# Patient Record
Sex: Female | Born: 1950 | Hispanic: No | Marital: Married | Smoking: Never smoker
Health system: Southern US, Community
[De-identification: ages and names within clinical notes are randomized; demographics above are authoritative.]

## PROBLEM LIST (undated history)

## (undated) DIAGNOSIS — E039 Hypothyroidism, unspecified: Secondary | ICD-10-CM

## (undated) DIAGNOSIS — Z5189 Encounter for other specified aftercare: Secondary | ICD-10-CM

## (undated) DIAGNOSIS — M1990 Unspecified osteoarthritis, unspecified site: Secondary | ICD-10-CM

## (undated) DIAGNOSIS — T7840XA Allergy, unspecified, initial encounter: Secondary | ICD-10-CM

## (undated) DIAGNOSIS — E785 Hyperlipidemia, unspecified: Secondary | ICD-10-CM

## (undated) DIAGNOSIS — E119 Type 2 diabetes mellitus without complications: Secondary | ICD-10-CM

## (undated) HISTORY — PX: ACROMIOPLASTY: SHX131

## (undated) HISTORY — PX: CARPAL TUNNEL RELEASE: SHX101

## (undated) HISTORY — DX: Hypothyroidism, unspecified: E03.9

## (undated) HISTORY — DX: Unspecified osteoarthritis, unspecified site: M19.90

## (undated) HISTORY — DX: Type 2 diabetes mellitus without complications: E11.9

## (undated) HISTORY — PX: ECTOPIC PREGNANCY SURGERY: SHX613

## (undated) HISTORY — DX: Hyperlipidemia, unspecified: E78.5

## (undated) HISTORY — PX: BREAST REDUCTION SURGERY: SHX8

## (undated) HISTORY — DX: Allergy, unspecified, initial encounter: T78.40XA

## (undated) HISTORY — DX: Encounter for other specified aftercare: Z51.89

---
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
## 1975-04-27 HISTORY — PX: SEPTOPLASTY: SUR1290

## 1985-04-26 HISTORY — PX: MYOMECTOMY: SHX85

## 1997-04-26 HISTORY — PX: LASIK: SHX215

## 1998-04-26 HISTORY — PX: ABDOMINAL HYSTERECTOMY: SHX81

## 2008-04-26 HISTORY — PX: ULNAR NERVE TRANSPOSITION: SHX2595

## 2008-12-21 ENCOUNTER — Encounter: Payer: Self-pay | Admitting: Internal Medicine

## 2009-04-24 ENCOUNTER — Ambulatory Visit: Payer: Self-pay | Admitting: Internal Medicine

## 2009-04-24 DIAGNOSIS — E785 Hyperlipidemia, unspecified: Secondary | ICD-10-CM

## 2009-04-24 DIAGNOSIS — Z860100 Personal history of colon polyps, unspecified: Secondary | ICD-10-CM | POA: Insufficient documentation

## 2009-04-24 DIAGNOSIS — M67919 Unspecified disorder of synovium and tendon, unspecified shoulder: Secondary | ICD-10-CM | POA: Insufficient documentation

## 2009-04-24 DIAGNOSIS — E1165 Type 2 diabetes mellitus with hyperglycemia: Secondary | ICD-10-CM | POA: Insufficient documentation

## 2009-04-24 DIAGNOSIS — I10 Essential (primary) hypertension: Secondary | ICD-10-CM | POA: Insufficient documentation

## 2009-04-24 DIAGNOSIS — M719 Bursopathy, unspecified: Secondary | ICD-10-CM

## 2009-04-24 LAB — CONVERTED CEMR LAB
AST: 26 units/L (ref 0–37)
Albumin: 4.3 g/dL (ref 3.5–5.2)
Basophils Relative: 1.9 % (ref 0.0–3.0)
CO2: 25 meq/L (ref 19–32)
Chloride: 105 meq/L (ref 96–112)
Cholesterol: 145 mg/dL (ref 0–200)
Creatinine, Ser: 0.6 mg/dL (ref 0.4–1.2)
Eosinophils Relative: 5 % (ref 0.0–5.0)
Hemoglobin, Urine: NEGATIVE
Hgb A1c MFr Bld: 7.5 % — ABNORMAL HIGH (ref 4.6–6.5)
LDL Cholesterol: 82 mg/dL (ref 0–99)
Lymphocytes Relative: 39.8 % (ref 12.0–46.0)
Lymphs Abs: 1.7 10*3/uL (ref 0.7–4.0)
MCHC: 34.1 g/dL (ref 30.0–36.0)
Monocytes Absolute: 0.3 10*3/uL (ref 0.1–1.0)
Monocytes Relative: 6.4 % (ref 3.0–12.0)
Neutrophils Relative %: 46.9 % (ref 43.0–77.0)
Nitrite: NEGATIVE
Sodium: 140 meq/L (ref 135–145)
Specific Gravity, Urine: 1.02 (ref 1.000–1.030)
TSH: 3.3 microintl units/mL (ref 0.35–5.50)
Total Bilirubin: 1 mg/dL (ref 0.3–1.2)
Total CHOL/HDL Ratio: 4
Urine Glucose: NEGATIVE mg/dL
VLDL: 27.6 mg/dL (ref 0.0–40.0)
WBC: 4.2 10*3/uL — ABNORMAL LOW (ref 4.5–10.5)
pH: 5.5 (ref 5.0–8.0)

## 2009-04-26 HISTORY — PX: SHOULDER ARTHROSCOPY: SHX128

## 2009-04-26 HISTORY — PX: ANKLE SURGERY: SHX546

## 2009-04-29 ENCOUNTER — Telehealth: Payer: Self-pay | Admitting: Internal Medicine

## 2009-04-29 ENCOUNTER — Encounter: Payer: Self-pay | Admitting: Internal Medicine

## 2009-04-30 ENCOUNTER — Encounter: Payer: Self-pay | Admitting: Internal Medicine

## 2009-05-12 ENCOUNTER — Encounter: Admission: RE | Admit: 2009-05-12 | Discharge: 2009-05-12 | Payer: Self-pay | Admitting: Internal Medicine

## 2009-06-09 ENCOUNTER — Telehealth: Payer: Self-pay | Admitting: Internal Medicine

## 2009-06-16 ENCOUNTER — Encounter: Payer: Self-pay | Admitting: Internal Medicine

## 2009-06-25 ENCOUNTER — Telehealth: Payer: Self-pay | Admitting: Internal Medicine

## 2009-07-22 ENCOUNTER — Ambulatory Visit: Payer: Self-pay | Admitting: Internal Medicine

## 2009-07-22 DIAGNOSIS — K7689 Other specified diseases of liver: Secondary | ICD-10-CM | POA: Insufficient documentation

## 2009-08-04 ENCOUNTER — Encounter: Payer: Self-pay | Admitting: Internal Medicine

## 2009-09-23 ENCOUNTER — Ambulatory Visit: Payer: Self-pay | Admitting: Internal Medicine

## 2009-09-23 LAB — CONVERTED CEMR LAB
ALT: 53 units/L — ABNORMAL HIGH (ref 0–35)
AST: 28 units/L (ref 0–37)
Albumin: 4.6 g/dL (ref 3.5–5.2)
Alkaline Phosphatase: 56 units/L (ref 39–117)
BUN: 15 mg/dL (ref 6–23)
Basophils Absolute: 0 10*3/uL (ref 0.0–0.1)
Basophils Relative: 0.4 % (ref 0.0–3.0)
Bilirubin, Direct: 0.1 mg/dL (ref 0.0–0.3)
Calcium: 10.2 mg/dL (ref 8.4–10.5)
Cholesterol: 162 mg/dL (ref 0–200)
Creatinine, Ser: 0.6 mg/dL (ref 0.4–1.2)
Eosinophils Relative: 2.1 % (ref 0.0–5.0)
GFR calc non Af Amer: 110.64 mL/min (ref >60)
Glucose, Bld: 135 mg/dL — ABNORMAL HIGH (ref 70–99)
Hemoglobin: 13.8 g/dL (ref 12.0–15.0)
Lymphocytes Relative: 40 % (ref 12.0–46.0)
Lymphs Abs: 1.8 10*3/uL (ref 0.7–4.0)
MCHC: 33.6 g/dL (ref 30.0–36.0)
Monocytes Absolute: 0.3 10*3/uL (ref 0.1–1.0)
Monocytes Relative: 6.3 % (ref 3.0–12.0)
Neutro Abs: 2.2 10*3/uL (ref 1.4–7.7)
Neutrophils Relative %: 51.2 % (ref 43.0–77.0)
Total CHOL/HDL Ratio: 4
Total Protein: 7.5 g/dL (ref 6.0–8.3)
VLDL: 34 mg/dL (ref 0.0–40.0)
WBC: 4.4 10*3/uL — ABNORMAL LOW (ref 4.5–10.5)

## 2009-12-15 ENCOUNTER — Encounter: Payer: Self-pay | Admitting: Internal Medicine

## 2009-12-26 ENCOUNTER — Encounter: Admission: RE | Admit: 2009-12-26 | Discharge: 2009-12-26 | Payer: Self-pay | Admitting: Orthopedic Surgery

## 2010-01-06 ENCOUNTER — Other Ambulatory Visit: Admission: RE | Admit: 2010-01-06 | Discharge: 2010-01-06 | Payer: Self-pay | Admitting: Internal Medicine

## 2010-01-06 ENCOUNTER — Ambulatory Visit: Payer: Self-pay | Admitting: Internal Medicine

## 2010-01-06 LAB — CONVERTED CEMR LAB
Albumin: 4.4 g/dL (ref 3.5–5.2)
Alkaline Phosphatase: 51 units/L (ref 39–117)
Basophils Relative: 0.9 % (ref 0.0–3.0)
Bilirubin, Direct: 0.1 mg/dL (ref 0.0–0.3)
CO2: 28 meq/L (ref 19–32)
Chloride: 106 meq/L (ref 96–112)
Creatinine, Ser: 0.6 mg/dL (ref 0.4–1.2)
Eosinophils Absolute: 0.1 10*3/uL (ref 0.0–0.7)
Eosinophils Relative: 3.6 % (ref 0.0–5.0)
GFR calc non Af Amer: 110.53 mL/min (ref >60)
Glucose, Bld: 101 mg/dL — ABNORMAL HIGH (ref 70–99)
HCT: 38 % (ref 36.0–46.0)
Hemoglobin: 12.9 g/dL (ref 12.0–15.0)
Leukocytes, UA: NEGATIVE
Lymphocytes Relative: 39 % (ref 12.0–46.0)
Lymphs Abs: 1.6 10*3/uL (ref 0.7–4.0)
MCV: 90 fL (ref 78.0–100.0)
Monocytes Absolute: 0.2 10*3/uL (ref 0.1–1.0)
Monocytes Relative: 5.8 % (ref 3.0–12.0)
Pap Smear: NEGATIVE
Platelets: 168 10*3/uL (ref 150.0–400.0)
RBC: 4.23 M/uL (ref 3.87–5.11)
Total Bilirubin: 0.5 mg/dL (ref 0.3–1.2)
Total CHOL/HDL Ratio: 4
Total Protein, Urine: NEGATIVE mg/dL
Total Protein: 6.8 g/dL (ref 6.0–8.3)
WBC: 4.1 10*3/uL — ABNORMAL LOW (ref 4.5–10.5)

## 2010-01-07 ENCOUNTER — Encounter: Payer: Self-pay | Admitting: Internal Medicine

## 2010-02-12 ENCOUNTER — Encounter: Payer: Self-pay | Admitting: Internal Medicine

## 2010-02-26 ENCOUNTER — Telehealth: Payer: Self-pay | Admitting: Internal Medicine

## 2010-03-09 ENCOUNTER — Encounter (INDEPENDENT_AMBULATORY_CARE_PROVIDER_SITE_OTHER): Payer: Self-pay | Admitting: *Deleted

## 2010-03-18 ENCOUNTER — Encounter: Payer: Self-pay | Admitting: Internal Medicine

## 2010-03-23 ENCOUNTER — Encounter (INDEPENDENT_AMBULATORY_CARE_PROVIDER_SITE_OTHER): Payer: Self-pay

## 2010-03-24 ENCOUNTER — Ambulatory Visit: Payer: Self-pay | Admitting: Gastroenterology

## 2010-03-24 ENCOUNTER — Encounter: Payer: Self-pay | Admitting: Internal Medicine

## 2010-03-24 ENCOUNTER — Encounter (INDEPENDENT_AMBULATORY_CARE_PROVIDER_SITE_OTHER): Payer: Self-pay

## 2010-03-25 ENCOUNTER — Ambulatory Visit: Payer: Self-pay | Admitting: Internal Medicine

## 2010-03-25 ENCOUNTER — Encounter: Payer: Self-pay | Admitting: Endocrinology

## 2010-03-25 LAB — CONVERTED CEMR LAB
AST: 22 units/L (ref 0–37)
BUN: 17 mg/dL (ref 6–23)
Basophils Absolute: 0 10*3/uL (ref 0.0–0.1)
Basophils Relative: 0.5 % (ref 0.0–3.0)
Bilirubin, Direct: 0.1 mg/dL (ref 0.0–0.3)
Chloride: 102 meq/L (ref 96–112)
Eosinophils Relative: 2.3 % (ref 0.0–5.0)
Glucose, Bld: 138 mg/dL — ABNORMAL HIGH (ref 70–99)
HCT: 41.1 % (ref 36.0–46.0)
HDL: 34.9 mg/dL — ABNORMAL LOW (ref >39.00)
Hemoglobin: 14 g/dL (ref 12.0–15.0)
LDL Cholesterol: 75 mg/dL (ref 0–99)
MCV: 88.7 fL (ref 78.0–100.0)
Monocytes Absolute: 0.4 10*3/uL (ref 0.1–1.0)
Monocytes Relative: 7.1 % (ref 3.0–12.0)
Neutrophils Relative %: 49.3 % (ref 43.0–77.0)
Potassium: 4.4 meq/L (ref 3.5–5.1)
RDW: 13.4 % (ref 11.5–14.6)
Sodium: 139 meq/L (ref 135–145)
TSH: 3 microintl units/mL (ref 0.35–5.50)
Total CHOL/HDL Ratio: 4
Total Protein: 6.5 g/dL (ref 6.0–8.3)
Triglycerides: 143 mg/dL (ref 0.0–149.0)
VLDL: 28.6 mg/dL (ref 0.0–40.0)
WBC: 5.9 10*3/uL (ref 4.5–10.5)

## 2010-03-30 ENCOUNTER — Encounter: Payer: Self-pay | Admitting: Internal Medicine

## 2010-04-01 ENCOUNTER — Ambulatory Visit: Payer: Self-pay | Admitting: Gastroenterology

## 2010-04-03 ENCOUNTER — Encounter: Payer: Self-pay | Admitting: Gastroenterology

## 2010-05-13 ENCOUNTER — Encounter
Admission: RE | Admit: 2010-05-13 | Discharge: 2010-05-13 | Payer: Self-pay | Source: Home / Self Care | Attending: Internal Medicine | Admitting: Internal Medicine

## 2010-05-15 ENCOUNTER — Telehealth (INDEPENDENT_AMBULATORY_CARE_PROVIDER_SITE_OTHER): Payer: Self-pay | Admitting: *Deleted

## 2010-05-26 NOTE — Letter (Signed)
Summary: [facility]  [facility]   Imported By: Sherian Rein 08/21/2009 09:52:47  _____________________________________________________________________  External Attachment:    Type:   Image     Comment:   External Document

## 2010-05-26 NOTE — Letter (Signed)
Summary: Lipid Letter  [facility]  [address] [place], [place] [address]   Phone: [phone]  Fax: [phone]    09/23/2009  Safaa Stingley [address] [place], [place]  [address]  Dear [name]:  We have carefully reviewed your last lipid profile from 09/23/2009 and the results are noted below with a summary of recommendations for lipid management.    Cholesterol:       162     Goal: <200   HDL "good" Cholesterol:   16.60     Goal: >40   LDL "bad" Cholesterol:   85     Goal: <100   Triglycerides:       170.0     Goal: <150    BLOOD SUGARS ARE STILL TOO HIGH. LIVER ENZYME IS SLIGHTLY ELEVATED.    TLC Diet (Therapeutic Lifestyle Change): Saturated Fats & Transfatty acids should be kept < 7% of total calories ***Reduce Saturated Fats Polyunstaurated Fat can be up to 10% of total calories Monounsaturated Fat Fat can be up to 20% of total calories Total Fat should be no greater than 25-35% of total calories Carbohydrates should be 50-60% of total calories Protein should be approximately 15% of total calories Fiber should be at least 20-30 grams a day ***Increased fiber may help lower LDL Total Cholesterol should be < 200mg /day Consider adding plant stanol/sterols to diet (example: Benacol spread) ***A higher intake of unsaturated fat may reduce Triglycerides and Increase HDL    Adjunctive Measures (may lower LIPIDS and reduce risk of Heart Attack) include: Aerobic Exercise (20-30 minutes 3-4 times a week) Limit Alcohol Consumption Weight Reduction Aspirin 75-81 mg a day by mouth (if not allergic or contraindicated) Dietary Fiber 20-30 grams a day by mouth     Current Medications: 1)    Metformin Hcl 1000 Mg Tabs (Metformin hcl) .... Take 1 tablet by mouth two times a day 2)    Simvastatin 80 Mg Tabs (Simvastatin) .... Take 1 tab by mouth at bedtime 3)    Lantus Solostar 100 Unit/ml Soln (Insulin glargine) .... 33 units at bedtime 4)    Lisinopril 10 Mg  Tabs (Lisinopril) .... Take 1 tablet by mouth once a day 5)    Colace  .... At bedtime as needed 6)    Asa 81mg   7)    Vitamin C 1000 Mg Tabs (Ascorbic acid) 8)    Onetouch Test  Strp (Glucose blood) .... Test as directed 9)    Robaxin 500 Mg Tabs (Methocarbamol) 10)    Naproxen 500 Mg Tabs (Naproxen) .... Take 1 tablet by mouth once a day 11)    Dilaudid 2 Mg Tabs (Hydromorphone hcl) .Marland Kitchen.. 1-2 every 4-6 hours prn 12)    Temazepam 30 Mg Caps (Temazepam) .... Take 1 tab by mouth at bedtime 13)    Co-q 100mg    If you have any questions, please call. We appreciate being able to work with you.   Sincerely,    [facility] Etta Grandchild MD

## 2010-05-26 NOTE — Letter (Signed)
Summary: Lipid Letter  [facility]  [address] [place], [place] [address]   Phone: [phone]  Fax: [phone]    01/06/2010  Jennifer Baker [address] [place], [place]  [address]  Dear Jennifer Baker:  We have carefully reviewed your last lipid profile from 01/06/2010 and the results are noted below with a summary of recommendations for lipid management.    Cholesterol:       125     Goal: <200   HDL "good" Cholesterol:   95.28     Goal: >40   LDL "bad" Cholesterol:   72     Goal: <100   Triglycerides:       104.0     Goal: <150        TLC Diet (Therapeutic Lifestyle Change): Saturated Fats & Transfatty acids should be kept < 7% of total calories ***Reduce Saturated Fats Polyunstaurated Fat can be up to 10% of total calories Monounsaturated Fat Fat can be up to 20% of total calories Total Fat should be no greater than 25-35% of total calories Carbohydrates should be 50-60% of total calories Protein should be approximately 15% of total calories Fiber should be at least 20-30 grams a day ***Increased fiber may help lower LDL Total Cholesterol should be < 200mg /day Consider adding plant stanol/sterols to diet (example: Benacol spread) ***A higher intake of unsaturated fat may reduce Triglycerides and Increase HDL    Adjunctive Measures (may lower LIPIDS and reduce risk of Heart Attack) include: Aerobic Exercise (20-30 minutes 3-4 times a week) Limit Alcohol Consumption Weight Reduction Aspirin 75-81 mg a day by mouth (if not allergic or contraindicated) Dietary Fiber 20-30 grams a day by mouth     Current Medications: 1)    Metformin Hcl 1000 Mg Tabs (Metformin hcl) .... Take 1 tablet by mouth two times a day 2)    Simvastatin 80 Mg Tabs (Simvastatin) .... Take 1 tab by mouth at bedtime 3)    Lantus Solostar 100 Unit/ml Soln (Insulin glargine) .... 33 units at bedtime 4)    Lisinopril 10 Mg Tabs (Lisinopril) .... Take 1 tablet by mouth once a day 5)     Colace  .... At bedtime as needed 6)    Asa 81mg   7)    Vitamin C 1000 Mg Tabs (Ascorbic acid) 8)    Onetouch Test  Strp (Glucose blood) .... Test as directed 9)    Robaxin 500 Mg Tabs (Methocarbamol) 10)    Naproxen 500 Mg Tabs (Naproxen) .... Take 1 tablet by mouth once a day 11)    Dilaudid 2 Mg Tabs (Hydromorphone hcl) .Marland Kitchen.. 1-2 every 4-6 hours prn 12)    Temazepam 30 Mg Caps (Temazepam) .... Take 1 tab by mouth at bedtime 13)    Co-q 100mg   14)    Vitamin E  .... Daily 15)    Vit D  16)    Multiple Vitamin   If you have any questions, please call. We appreciate being able to work with you.   Sincerely,    [facility] Etta Grandchild MD

## 2010-05-26 NOTE — Assessment & Plan Note (Signed)
Summary: 3 mos f/u #/ cd   Vital Signs:  Patient profile:   60 year old female Height:      60 inches Weight:      150 pounds BMI:     29.40 O2 Sat:      98 % on Room air Temp:     97.0 degrees F oral Pulse rate:   66 / minute Pulse rhythm:   regular Resp:     16 per minute BP sitting:   110 / 64  (left arm) Cuff size:   regular  Vitals Entered By: Bill Salinas CMA (January 06, 2010 9:28 AM)  Nutrition Counseling: Patient's BMI is greater than 25 and therefore counseled on weight management options.  O2 Flow:  Room air CC: 3 month follow up, Preventive Care Is Patient Diabetic? Yes Did you bring your meter with you today? Yes Pain Assessment Patient in pain? no      Comments pt will get flu shot today   Primary Care Provider:  Etta Grandchild MD  CC:  3 month follow up and Preventive Care.  History of Present Illness:  Follow-Up Visit      This is a 60 year old woman who presents for Follow-up visit.  The patient denies chest pain, palpitations, dizziness, syncope, low blood sugar symptoms, high blood sugar symptoms, edema, SOB, DOE, PND, and orthopnea.  Since the last visit the patient notes no new problems or concerns and being seen by a specialist.  The patient reports taking meds as prescribed, monitoring BP, monitoring blood sugars, and dietary noncompliance.  When questioned about possible medication side effects, the patient notes none.    Preventive Screening-Counseling & Management  Alcohol-Tobacco     Alcohol drinks/day: 0     Smoking Status: never     Tobacco Counseling: not indicated; no tobacco use  Hep-HIV-STD-Contraception     Hepatitis Risk: no risk noted     HIV Risk: no risk noted     STD Risk: no risk noted     SBE monthly: yes     SBE Education/Counseling: to perform regular SBE  Safety-Violence-Falls     Seat Belt Use: yes     Helmet Use: yes     Firearms in the Home: no firearms in the home     Smoke Detectors: yes     Violence in  the Home: no risk noted      Sexual History:  currently monogamous.        Drug Use:  never.        Blood Transfusions:  no.    Clinical Review Panels:  Prevention   Last Mammogram:  ASSESSMENT: Negative - BI-RADS 1^MM DIGITAL SCREENING (05/12/2009)   Last Colonoscopy:  Adenomatous Polyp (02/15/2007)  Immunizations   Last Tetanus Booster:  Tdap (01/06/2010)   Last Flu Vaccine:  Fluvax 3+ (01/06/2010)   Last Pneumovax:  Historical (01/24/2009)  Lipid Management   Cholesterol:  162 (09/23/2009)   LDL (bad choesterol):  85 (09/23/2009)   HDL (good cholesterol):  42.80 (09/23/2009)  Diabetes Management   HgBA1C:  7.6 (09/23/2009)   Creatinine:  0.6 (09/23/2009)   Last Dilated Eye Exam:  normal (06/16/2009)   Last Foot Exam:  yes (01/06/2010)   Last Flu Vaccine:  Fluvax 3+ (01/06/2010)   Last Pneumovax:  Historical (01/24/2009)  CBC   WBC:  4.4 (09/23/2009)   RBC:  4.57 (09/23/2009)   Hgb:  13.8 (09/23/2009)   Hct:  40.9 (09/23/2009)  Platelets:  202.0 (09/23/2009)   MCV  89.7 (09/23/2009)   MCHC  33.6 (09/23/2009)   RDW  13.7 (09/23/2009)   PMN:  51.2 (09/23/2009)   Lymphs:  40.0 (09/23/2009)   Monos:  6.3 (09/23/2009)   Eosinophils:  2.1 (09/23/2009)   Basophil:  0.4 (09/23/2009)  Complete Metabolic Panel   Glucose:  135 (09/23/2009)   Sodium:  142 (09/23/2009)   Potassium:  4.4 (09/23/2009)   Chloride:  103 (09/23/2009)   CO2:  28 (09/23/2009)   BUN:  15 (09/23/2009)   Creatinine:  0.6 (09/23/2009)   Albumin:  4.6 (09/23/2009)   Total Protein:  7.5 (09/23/2009)   Calcium:  10.2 (09/23/2009)   Total Bili:  0.4 (09/23/2009)   Alk Phos:  56 (09/23/2009)   SGPT (ALT):  53 (09/23/2009)   SGOT (AST):  28 (09/23/2009)   Medications Prior to Update: 1)  Metformin Hcl 1000 Mg Tabs (Metformin Hcl) .... Take 1 Tablet By Mouth Two Times A Day 2)  Simvastatin 80 Mg Tabs (Simvastatin) .... Take 1 Tab By Mouth At Bedtime 3)  Lantus Solostar 100 Unit/ml Soln (Insulin  Glargine) .... 33 Units At Bedtime 4)  Lisinopril 10 Mg Tabs (Lisinopril) .... Take 1 Tablet By Mouth Once A Day 5)  Colace .... At Bedtime As Needed 6)  Asa 81mg  7)  Vitamin C 1000 Mg Tabs (Ascorbic Acid) 8)  Onetouch Test  Strp (Glucose Blood) .... Test As Directed 9)  Robaxin 500 Mg Tabs (Methocarbamol) 10)  Naproxen 500 Mg Tabs (Naproxen) .... Take 1 Tablet By Mouth Once A Day 11)  Dilaudid 2 Mg Tabs (Hydromorphone Hcl) .Marland Kitchen.. 1-2 Every 4-6 Hours Prn 12)  Temazepam 30 Mg Caps (Temazepam) .... Take 1 Tab By Mouth At Bedtime 13)  Co-Q 100mg   Current Medications (verified): 1)  Metformin Hcl 1000 Mg Tabs (Metformin Hcl) .... Take 1 Tablet By Mouth Two Times A Day 2)  Simvastatin 80 Mg Tabs (Simvastatin) .... Take 1 Tab By Mouth At Bedtime 3)  Lantus Solostar 100 Unit/ml Soln (Insulin Glargine) .... 33 Units At Bedtime 4)  Lisinopril 10 Mg Tabs (Lisinopril) .... Take 1 Tablet By Mouth Once A Day 5)  Colace .... At Bedtime As Needed 6)  Asa 81mg  7)  Vitamin C 1000 Mg Tabs (Ascorbic Acid) 8)  Onetouch Test  Strp (Glucose Blood) .... Test As Directed 9)  Robaxin 500 Mg Tabs (Methocarbamol) 10)  Naproxen 500 Mg Tabs (Naproxen) .... Take 1 Tablet By Mouth Once A Day 11)  Dilaudid 2 Mg Tabs (Hydromorphone Hcl) .Marland Kitchen.. 1-2 Every 4-6 Hours Prn 12)  Temazepam 30 Mg Caps (Temazepam) .... Take 1 Tab By Mouth At Bedtime 13)  Co-Q 100mg  14)  Vitamin E .... Daily 15)  Vit D 16)  Multiple Vitamin  Allergies (verified): No Known Drug Allergies  Past History:  Past Medical History: Last updated: 04/24/2009 Diabetes mellitus, type II Hyperlipidemia Hypertension Colonic polyps, hx of  Past Surgical History: Last updated: 04/24/2009 Carpal tunnel release Hysterectomy Rotator cuff repair  Family History: Last updated: 04/24/2009 Family History of Alcoholism/Addiction Family History Diabetes 1st degree relative Family History High cholesterol Family History Hypertension Family History  Kidney disease Family History of Stroke F 1st degree relative <60  Social History: Last updated: 04/24/2009 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  Family History of Alcoholism/Addiction Family History Diabetes 1st degree relative Family History High cholesterol Family History Hypertension Family History Kidney disease Family History of Stroke F 1st degree relative <60  Social History: Reviewed history from 04/24/2009 and no changes required. 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  The patient denies anorexia, fever, weight loss, chest pain, syncope, dyspnea on exertion, peripheral edema, prolonged cough, headaches, hemoptysis, abdominal pain, melena, hematochezia, severe indigestion/heartburn, hematuria, suspicious skin lesions, difficulty walking, depression, enlarged lymph nodes, angioedema, and breast masses.   GI:  Denies abdominal pain, bloody stools, change in bowel habits, nausea, vomiting, and vomiting blood. GU:  Denies abnormal vaginal bleeding, discharge, dysuria, genital sores, hematuria, incontinence, nocturia, urinary frequency, and urinary hesitancy. Endo:  Denies cold intolerance, excessive hunger, excessive thirst, excessive urination, heat intolerance, and polyuria.  Physical Exam  General:  alert, well-developed, well-nourished, well-hydrated, appropriate dress, normal appearance, healthy-appearing, cooperative to examination, good hygiene, and overweight-appearing.   Head:  normocephalic, atraumatic, no abnormalities observed, and no abnormalities palpated.     Eyes:  no icterus Ears:  R ear normal and L ear normal.   Mouth:  Oral mucosa and oropharynx without lesions or exudates.  Teeth in good repair. Neck:  No deformities, masses, or tenderness noted. Breasts:  skin/areolae normal, no masses, no abnormal thickening, no nipple discharge, no tenderness, and no adenopathy.   Lungs:  normal respiratory effort, no intercostal retractions, no accessory muscle use, normal breath sounds, no dullness, no fremitus, no crackles, and no wheezes.   Heart:  normal rate, regular rhythm, no murmur, no gallop, no rub, and no JVD.   Abdomen:  soft, non-tender, normal bowel sounds, no distention, no masses, no guarding, no rigidity, no rebound tenderness, no abdominal hernia, no inguinal hernia, no hepatomegaly, no splenomegaly, and abdominal scar(s).   Rectal:  No external abnormalities noted. Normal sphincter tone. No rectal masses or tenderness. Genitalia:  normal introitus, no external lesions, no vaginal discharge, mucosa pink and moist, no vaginal or cervical lesions, no vaginal atrophy, no friaility or hemorrhage, and no adnexal masses or tenderness.   Msk:  normal ROM, no joint tenderness, no joint swelling, no joint warmth, no redness over joints, no joint deformities, no joint instability, and no crepitation.   Pulses:  R and L carotid,radial,femoral,dorsalis pedis and posterior tibial pulses are full and equal bilaterally Extremities:  No clubbing, cyanosis, edema, or deformity noted with normal full range of motion of all joints.   Neurologic:  No cranial nerve deficits noted. Station and gait are normal. Plantar reflexes are down-going bilaterally. DTRs are symmetrical throughout. Sensory, motor and coordinative functions appear intact. Skin:  Intact without suspicious lesions or rashes Cervical Nodes:  No lymphadenopathy noted Psych:  Cognition and judgment appear intact. Alert and cooperative with normal attention span and concentration. No apparent  delusions, illusions, hallucinations  Diabetes Management Exam:    Foot Exam (with socks and/or shoes not present):       Sensory-Pinprick/Light touch:          Left medial foot (L-4): normal          Left dorsal foot (L-5): normal          Left lateral foot (S-1): normal          Right medial foot (L-4): normal  Right dorsal foot (L-5): normal          Right lateral foot (S-1): normal       Sensory-Monofilament:          Left foot: normal          Right foot: normal       Inspection:          Left foot: normal          Right foot: normal       Nails:          Left foot: normal          Right foot: normal   Impression & Recommendations:  Problem # 1:  FATTY LIVER DISEASE (ICD-571.8) Assessment Unchanged  Orders: Venipuncture (04540) TLB-Lipid Panel (80061-LIPID) TLB-BMP (Basic Metabolic Panel-BMET) (80048-METABOL) TLB-CBC Platelet - w/Differential (85025-CBCD) TLB-Hepatic/Liver Function Pnl (80076-HEPATIC) TLB-A1C / Hgb A1C (Glycohemoglobin) (83036-A1C) TLB-Udip w/ Micro (81001-URINE)  Problem # 2:  ROUTINE GENERAL MEDICAL EXAM@HEALTH  CARE FACL (ICD-V70.0)  Orders: Venipuncture (98119) TLB-Lipid Panel (80061-LIPID) TLB-BMP (Basic Metabolic Panel-BMET) (80048-METABOL) TLB-CBC Platelet - w/Differential (85025-CBCD) TLB-Hepatic/Liver Function Pnl (80076-HEPATIC) TLB-A1C / Hgb A1C (Glycohemoglobin) (83036-A1C) TLB-Udip w/ Micro (81001-URINE) Radiology Referral (Radiology) Pap Smear, Thin Prep ( Collection of) (J4782) Hemoccult Guaiac-1 spec.(in office) (82270)  Mammogram: ASSESSMENT: Negative - BI-RADS 1^MM DIGITAL SCREENING (05/12/2009) Colonoscopy: Adenomatous Polyp (02/15/2007) Bone Density: Normal (10/23/2008) Td Booster: Tdap (01/06/2010)   Flu Vax: Fluvax 3+ (01/06/2010)   Pneumovax: Historical (01/24/2009) Chol: 162 (09/23/2009)   HDL: 42.80 (09/23/2009)   LDL: 85 (09/23/2009)   TG: 170.0 (09/23/2009) TSH: 3.30 (04/24/2009)   HgbA1C: 7.6 (09/23/2009)     Discussed using sunscreen, use of alcohol, drug use, self breast exam, routine dental care, routine eye care, schedule for GYN exam, routine physical exam, seat belts, multiple vitamins, osteoporosis prevention, adequate calcium intake in diet, recommendations for immunizations, mammograms and Pap smears.  Discussed exercise and checking cholesterol.    Problem # 3:  HYPERTENSION (ICD-401.9) Assessment: Improved  Her updated medication list for this problem includes:    Lisinopril 10 Mg Tabs (Lisinopril) .Marland Kitchen... Take 1 tablet by mouth once a day  Orders: Venipuncture (95621) TLB-Lipid Panel (80061-LIPID) TLB-BMP (Basic Metabolic Panel-BMET) (80048-METABOL) TLB-CBC Platelet - w/Differential (85025-CBCD) TLB-Hepatic/Liver Function Pnl (80076-HEPATIC) TLB-A1C / Hgb A1C (Glycohemoglobin) (83036-A1C) TLB-Udip w/ Micro (81001-URINE)  BP today: 110/64 Prior BP: 110/70 (09/23/2009)  Prior 10 Yr Risk Heart Disease: 9 % (07/22/2009)  Labs Reviewed: K+: 4.4 (09/23/2009) Creat: : 0.6 (09/23/2009)   Chol: 162 (09/23/2009)   HDL: 42.80 (09/23/2009)   LDL: 85 (09/23/2009)   TG: 170.0 (09/23/2009)  Problem # 4:  DIABETES MELLITUS, TYPE II (ICD-250.00) Assessment: Unchanged  Her updated medication list for this problem includes:    Metformin Hcl 1000 Mg Tabs (Metformin hcl) .Marland Kitchen... Take 1 tablet by mouth two times a day    Lantus Solostar 100 Unit/ml Soln (Insulin glargine) .Marland Kitchen... 33 units at bedtime    Lisinopril 10 Mg Tabs (Lisinopril) .Marland Kitchen... Take 1 tablet by mouth once a day  Orders: Venipuncture (30865) TLB-Lipid Panel (80061-LIPID) TLB-BMP (Basic Metabolic Panel-BMET) (80048-METABOL) TLB-CBC Platelet - w/Differential (85025-CBCD) TLB-Hepatic/Liver Function Pnl (80076-HEPATIC) TLB-A1C / Hgb A1C (Glycohemoglobin) (83036-A1C) TLB-Udip w/ Micro (81001-URINE)  Problem # 5:  HYPERLIPIDEMIA (ICD-272.4) Assessment: Unchanged  Her updated medication list for this problem includes:     Simvastatin 80 Mg Tabs (Simvastatin) .Marland Kitchen... Take 1 tab by mouth at bedtime  Orders: Venipuncture (78469) TLB-Lipid Panel (80061-LIPID) TLB-BMP (Basic Metabolic Panel-BMET) (80048-METABOL) TLB-CBC Platelet -  w/Differential (85025-CBCD) TLB-Hepatic/Liver Function Pnl (80076-HEPATIC) TLB-A1C / Hgb A1C (Glycohemoglobin) (83036-A1C) TLB-Udip w/ Micro (81001-URINE)  Labs Reviewed: SGOT: 28 (09/23/2009)   SGPT: 53 (09/23/2009)  Lipid Goals: Chol Goal: 200 (04/24/2009)   HDL Goal: 40 (04/24/2009)   LDL Goal: 100 (04/24/2009)   TG Goal: 150 (04/24/2009)  Prior 10 Yr Risk Heart Disease: 9 % (07/22/2009)   HDL:42.80 (09/23/2009), 35.90 (04/24/2009)  LDL:85 (09/23/2009), 82 (04/24/2009)  Chol:162 (09/23/2009), 145 (04/24/2009)  Trig:170.0 (09/23/2009), 138.0 (04/24/2009)  Complete Medication List: 1)  Metformin Hcl 1000 Mg Tabs (Metformin hcl) .... Take 1 tablet by mouth two times a day 2)  Simvastatin 80 Mg Tabs (Simvastatin) .... Take 1 tab by mouth at bedtime 3)  Lantus Solostar 100 Unit/ml Soln (Insulin glargine) .... 33 units at bedtime 4)  Lisinopril 10 Mg Tabs (Lisinopril) .... Take 1 tablet by mouth once a day 5)  Colace  .... At bedtime as needed 6)  Asa 81mg   7)  Vitamin C 1000 Mg Tabs (Ascorbic acid) 8)  Onetouch Test Strp (Glucose blood) .... Test as directed 9)  Robaxin 500 Mg Tabs (Methocarbamol) 10)  Naproxen 500 Mg Tabs (Naproxen) .... Take 1 tablet by mouth once a day 11)  Dilaudid 2 Mg Tabs (Hydromorphone hcl) .Marland Kitchen.. 1-2 every 4-6 hours prn 12)  Temazepam 30 Mg Caps (Temazepam) .... Take 1 tab by mouth at bedtime 13)  Co-q 100mg   14)  Vitamin E  .... Daily 15)  Vit D  16)  Multiple Vitamin   Other Orders: Gastroenterology Referral (GI) Admin 1st Vaccine (91478) Flu Vaccine 52yrs + (29562) Tdap => 61yrs IM (13086) Admin of Any Addtl Vaccine (57846)  Colorectal Screening:  Current Recommendations:    Hemoccult: NEG X 1 today  PAP Screening:    Hx Cervical Dysplasia  in last 5 yrs? No    3 normal PAP smears in last 5 yrs? No    Reviewed PAP smear recommendations:  PAP smear done  Mammogram Screening:    Last Mammogram:  05/12/2009    Reviewed Mammogram recommendations:  mammogram ordered  Osteoporosis Risk Assessment:  Risk Factors for Fracture or Low Bone Density:   Smoking status:       never  Immunization & Chemoprophylaxis:    Tetanus vaccine: Tdap  (01/06/2010)    Influenza vaccine: Fluvax 3+  (01/06/2010)    Pneumovax: Historical  (01/24/2009)  Patient Instructions: 1)  Please schedule a follow-up appointment in 3 months. 2)  It is important that you exercise regularly at least 20 minutes 5 times a week. If you develop chest pain, have severe difficulty breathing, or feel very tired , stop exercising immediately and seek medical attention. 3)  You need to lose weight. Consider a lower calorie diet and regular exercise.  4)  Schedule your mammogram. 5)  Schedule a colonoscopy/sigmoidoscopy to help detect colon cancer. 6)  Check your blood sugars regularly. If your readings are usually above 200 or below 70 you should contact our office. 7)  It is important that your Diabetic A1c level is checked every 3 months. 8)  See your eye doctor yearly to check for diabetic eye damage. 9)  Check your feet each night for sore areas, calluses or signs of infection. 10)  Check your Blood Pressure regularly. If it is above 130/80: you should make an appointment. Flu Vaccine Consent Questions 10)  Check your Blood Pressure regularly. If it is above 130/80: you should make an appointment.    Marland Kitchenlbflu Do you have  a history of severe allergic reactions to this vaccine? no    Any prior history of allergic reactions to egg and/or gelatin? no    Do you have a sensitivity to the preservative Thimersol? no    Do you have a past history of Guillan-Barre Syndrome? no    Do you currently have an acute febrile illness? no    Have you ever had a severe reaction to  latex? no    Vaccine information given and explained to patient? yes    Are you currently pregnant? no    Lot Number:[id number]   Exp Date:10/24/2010   Site Given  Left Deltoid IM  Immunizations Administered:  Tetanus Vaccine:    Vaccine Type: Tdap    Site: left deltoid    Mfr: [email or web address]    Dose: 0.5 ml    Route: IM    Given by: Rock Nephew CMA    Exp. Date: 01/15/2012    Lot #: [id number]    VIS given: 03/13/08 version given January 06, 2010.

## 2010-05-26 NOTE — Assessment & Plan Note (Signed)
Summary: 3 MO ROV /NWS  #   Vital Signs:  Patient profile:   60 year old female Height:      60 inches Weight:      152 pounds BMI:     29.79 O2 Sat:      98 % on Room air Temp:     97.5 degrees F oral Pulse rate:   70 / minute Pulse rhythm:   regular Resp:     16 per minute BP sitting:   98 / 60  (left arm) Cuff size:   large  Vitals Entered By: Rock Nephew CMA (July 22, 2009 10:18 AM)  O2 Flow:  Room air  Primary Care Provider:  Etta Grandchild MD   History of Present Illness: She returns for f/up with no complaints. She was told previously that she had fatty liver disease on an abd. u/s.  Hypertension History:      She denies headache, chest pain, palpitations, dyspnea with exertion, orthopnea, PND, peripheral edema, visual symptoms, neurologic problems, syncope, and side effects from treatment.  She notes no problems with any antihypertensive medication side effects.        Positive major cardiovascular risk factors include female age 82 years old or older, diabetes, hyperlipidemia, and hypertension.  Negative major cardiovascular risk factors include negative family history for ischemic heart disease and non-tobacco-user status.        Further assessment for target organ damage reveals no history of ASHD, cardiac end-organ damage (CHF/LVH), stroke/TIA, peripheral vascular disease, renal insufficiency, or hypertensive retinopathy.     Preventive Screening-Counseling & Management  Alcohol-Tobacco     Alcohol drinks/day: 0     Smoking Status: never  Hep-HIV-STD-Contraception     Hepatitis Risk: no risk noted     HIV Risk: no risk noted     STD Risk: no risk noted     SBE monthly: yes     SBE Education/Counseling: to perform regular SBE      Sexual History:  currently monogamous.        Drug Use:  no.        Blood Transfusions:  no.    Clinical Review Panels:  Prevention   Last Mammogram:  ASSESSMENT: Negative - BI-RADS 1^MM DIGITAL SCREENING (05/12/2009)   Last Colonoscopy:  Adenomatous Polyp (02/15/2007)  Immunizations   Last Tetanus Booster:  Td (02/10/1999)   Last Pneumovax:  Historical (01/24/2009)  Lipid Management   Cholesterol:  145 (04/24/2009)   LDL (bad choesterol):  82 (04/24/2009)   HDL (good cholesterol):  35.90 (04/24/2009)  Diabetes Management   HgBA1C:  7.5 (04/24/2009)   Creatinine:  0.6 (04/24/2009)   Last Dilated Eye Exam:  normal (06/16/2009)   Last Foot Exam:  yes (07/22/2009)   Last Pneumovax:  Historical (01/24/2009)  CBC   WBC:  4.2 (04/24/2009)   RBC:  4.66 (04/24/2009)   Hgb:  14.0 (04/24/2009)   Hct:  41.2 (04/24/2009)   Platelets:  178.0 (04/24/2009)   MCV  88.2 (04/24/2009)   MCHC  34.1 (04/24/2009)   RDW  12.7 (04/24/2009)   PMN:  46.9 (04/24/2009)   Lymphs:  39.8 (04/24/2009)   Monos:  6.4 (04/24/2009)   Eosinophils:  5.0 (04/24/2009)   Basophil:  1.9 (04/24/2009)  Complete Metabolic Panel   Glucose:  169 (04/24/2009)   Sodium:  140 (04/24/2009)   Potassium:  3.8 (04/24/2009)   Chloride:  105 (04/24/2009)   CO2:  25 (04/24/2009)   BUN:  13 (04/24/2009)  Creatinine:  0.6 (04/24/2009)   Albumin:  4.3 (04/24/2009)   Total Protein:  7.6 (04/24/2009)   Calcium:  9.6 (04/24/2009)   Total Bili:  1.0 (04/24/2009)   Alk Phos:  54 (04/24/2009)   SGPT (ALT):  43 (04/24/2009)   SGOT (AST):  26 (04/24/2009)   Medications Prior to Update: 1)  Metformin Hcl 1000 Mg Tabs (Metformin Hcl) .... Take 1 Tablet By Mouth Two Times A Day 2)  Simvastatin 80 Mg Tabs (Simvastatin) .... Take 1 Tab By Mouth At Bedtime 3)  Lantus Solostar 100 Unit/ml Soln (Insulin Glargine) .... 33 Units At Bedtime 4)  Lisinopril 10 Mg Tabs (Lisinopril) .... Take 1 Tablet By Mouth Once A Day 5)  Colace .... At Bedtime As Needed 6)  Asa 81mg  7)  Vitamin C 1000 Mg Tabs (Ascorbic Acid) 8)  Onetouch Test  Strp (Glucose Blood) .... Test As Directed  Current Medications (verified): 1)  Metformin Hcl 1000 Mg Tabs (Metformin Hcl)  .... Take 1 Tablet By Mouth Two Times A Day 2)  Simvastatin 80 Mg Tabs (Simvastatin) .... Take 1 Tab By Mouth At Bedtime 3)  Lantus Solostar 100 Unit/ml Soln (Insulin Glargine) .... 33 Units At Bedtime 4)  Lisinopril 10 Mg Tabs (Lisinopril) .... Take 1 Tablet By Mouth Once A Day 5)  Colace .... At Bedtime As Needed 6)  Asa 81mg  7)  Vitamin C 1000 Mg Tabs (Ascorbic Acid) 8)  Onetouch Test  Strp (Glucose Blood) .... Test As Directed 9)  Robaxin 500 Mg Tabs (Methocarbamol) 10)  Naproxen 500 Mg Tabs (Naproxen) .... Take 1 Tablet By Mouth Once A Day 11)  Dilaudid 2 Mg Tabs (Hydromorphone Hcl) .Marland Kitchen.. 1-2 Every 4-6 Hours Prn 12)  Temazepam 30 Mg Caps (Temazepam) .... Take 1 Tab By Mouth At Bedtime 13)  Co-Q 100mg   Allergies (verified): No Known Drug Allergies  Past History:  Past Medical History: Reviewed history from 04/24/2009 and no changes required. Diabetes mellitus, type II Hyperlipidemia Hypertension Colonic polyps, hx of  Past Surgical History: Reviewed history from 04/24/2009 and no changes required. Carpal tunnel release Hysterectomy Rotator cuff repair  Family History: Reviewed history from 04/24/2009 and no changes required. Family History of Alcoholism/Addiction Family History Diabetes 1st degree relative Family History High cholesterol Family History Hypertension Family History Kidney disease Family History of Stroke F 1st degree relative <60  Social History: Reviewed history from 04/24/2009 and no changes required. Retired RN/ Disabled Married Never Smoked Alcohol use-no Drug use-no Regular exercise-no  Review of Systems       The patient complains of weight gain.  The patient denies anorexia, fever, weight loss, chest pain, abdominal pain, hematuria, suspicious skin lesions, and angioedema.   GI:  Denies abdominal pain, bloody stools, change in bowel habits, diarrhea, loss of appetite, nausea, vomiting, and yellowish skin color.  Physical Exam  General:   alert, well-developed, well-nourished, well-hydrated, appropriate dress, normal appearance, healthy-appearing, cooperative to examination, good hygiene, and overweight-appearing.   Head:  normocephalic, atraumatic, no abnormalities observed, and no abnormalities palpated.   Eyes:  no icterus Ears:  R ear normal and L ear normal.   Mouth:  Oral mucosa and oropharynx without lesions or exudates.  Teeth in good repair. Neck:  No deformities, masses, or tenderness noted. Lungs:  Normal respiratory effort, chest expands symmetrically. Lungs are clear to auscultation, no crackles or wheezes. Heart:  Normal rate and regular rhythm. S1 and S2 normal without gallop, murmur, click, rub or other extra sounds. Abdomen:  soft,  non-tender, normal bowel sounds, no distention, no masses, no guarding, no rigidity, no rebound tenderness, no inguinal hernia, no hepatomegaly, and no splenomegaly.   Msk:  No deformity or scoliosis noted of thoracic or lumbar spine.   Pulses:  R and L carotid,radial,femoral,dorsalis pedis and posterior tibial pulses are full and equal bilaterally Extremities:  No clubbing, cyanosis, edema, or deformity noted with normal full range of motion of all joints.   Neurologic:  No cranial nerve deficits noted. Station and gait are normal. Plantar reflexes are down-going bilaterally. DTRs are symmetrical throughout. Sensory, motor and coordinative functions appear intact. Skin:  Intact without suspicious lesions or rashes Cervical Nodes:  no anterior cervical adenopathy and no posterior cervical adenopathy.   Axillary Nodes:  no R axillary adenopathy and no L axillary adenopathy.   Psych:  Cognition and judgment appear intact. Alert and cooperative with normal attention span and concentration. No apparent delusions, illusions, hallucinations  Diabetes Management Exam:    Foot Exam (with socks and/or shoes not present):       Sensory-Pinprick/Light touch:          Left medial foot (L-4):  normal          Left dorsal foot (L-5): normal          Left lateral foot (S-1): normal          Right medial foot (L-4): normal          Right dorsal foot (L-5): normal          Right lateral foot (S-1): normal       Sensory-Monofilament:          Left foot: normal          Right foot: normal       Inspection:          Left foot: normal          Right foot: normal       Nails:          Left foot: normal          Right foot: normal   Impression & Recommendations:  Problem # 1:  FATTY LIVER DISEASE (ICD-571.8) Assessment Unchanged  Problem # 2:  HYPERTENSION (ICD-401.9) Assessment: Improved  Her updated medication list for this problem includes:    Lisinopril 10 Mg Tabs (Lisinopril) .Marland Kitchen... Take 1 tablet by mouth once a day  BP today: 98/60 Prior BP: 140/82 (04/24/2009)  Prior 10 Yr Risk Heart Disease: Not enough information (04/24/2009)  Labs Reviewed: K+: 3.8 (04/24/2009) Creat: : 0.6 (04/24/2009)   Chol: 145 (04/24/2009)   HDL: 35.90 (04/24/2009)   LDL: 82 (04/24/2009)   TG: 138.0 (04/24/2009)  Problem # 3:  DIABETES MELLITUS, TYPE II (ICD-250.00) Assessment: Improved  Her updated medication list for this problem includes:    Metformin Hcl 1000 Mg Tabs (Metformin hcl) .Marland Kitchen... Take 1 tablet by mouth two times a day    Lantus Solostar 100 Unit/ml Soln (Insulin glargine) .Marland Kitchen... 33 units at bedtime    Lisinopril 10 Mg Tabs (Lisinopril) .Marland Kitchen... Take 1 tablet by mouth once a day  Labs Reviewed: Creat: 0.6 (04/24/2009)     Last Eye Exam: normal (06/16/2009) Reviewed HgBA1c results: 7.5 (04/24/2009)  Problem # 4:  HYPERLIPIDEMIA (ICD-272.4) Assessment: Improved  Her updated medication list for this problem includes:    Simvastatin 80 Mg Tabs (Simvastatin) .Marland Kitchen... Take 1 tab by mouth at bedtime  Labs Reviewed: SGOT: 26 (04/24/2009)   SGPT: 43 (04/24/2009)  Lipid Goals: Chol Goal: 200 (04/24/2009)   HDL Goal: 40 (04/24/2009)   LDL Goal: 100 (04/24/2009)   TG Goal: 150  (04/24/2009)  10 Yr Risk Heart Disease: 9 % Prior 10 Yr Risk Heart Disease: Not enough information (04/24/2009)   HDL:35.90 (04/24/2009)  LDL:82 (04/24/2009)  Chol:145 (04/24/2009)  Trig:138.0 (04/24/2009)  Complete Medication List: 1)  Metformin Hcl 1000 Mg Tabs (Metformin hcl) .... Take 1 tablet by mouth two times a day 2)  Simvastatin 80 Mg Tabs (Simvastatin) .... Take 1 tab by mouth at bedtime 3)  Lantus Solostar 100 Unit/ml Soln (Insulin glargine) .... 33 units at bedtime 4)  Lisinopril 10 Mg Tabs (Lisinopril) .... Take 1 tablet by mouth once a day 5)  Colace  .... At bedtime as needed 6)  Asa 81mg   7)  Vitamin C 1000 Mg Tabs (Ascorbic acid) 8)  Onetouch Test Strp (Glucose blood) .... Test as directed 9)  Robaxin 500 Mg Tabs (Methocarbamol) 10)  Naproxen 500 Mg Tabs (Naproxen) .... Take 1 tablet by mouth once a day 11)  Dilaudid 2 Mg Tabs (Hydromorphone hcl) .Marland Kitchen.. 1-2 every 4-6 hours prn 12)  Temazepam 30 Mg Caps (Temazepam) .... Take 1 tab by mouth at bedtime 13)  Co-q 100mg    Hypertension Assessment/Plan:      The patient's hypertensive risk group is category C: Target organ damage and/or diabetes.  Her calculated 10 year risk of coronary heart disease is 9 %.  Today's blood pressure is 98/60.  Her blood pressure goal is < 130/80.  Patient Instructions: 1)  Please schedule a follow-up appointment in 2 months. 2)  It is important that you exercise regularly at least 20 minutes 5 times a week. If you develop chest pain, have severe difficulty breathing, or feel very tired , stop exercising immediately and seek medical attention. 3)  You need to lose weight. Consider a lower calorie diet and regular exercise.  4)  Check your blood sugars regularly. If your readings are usually above 200  or below 70 you should contact our office. 5)  It is important that your Diabetic A1c level is checked every 3 months. 6)  See your eye doctor yearly to check for diabetic eye damage. 7)  Check your  feet each night for sore areas, calluses or signs of infection.

## 2010-05-26 NOTE — Progress Notes (Signed)
Summary: Refill  Phone Note Call from Patient Call back at Home Phone [phone]   Caller: Patient Call For: Etta Grandchild MD Reason for Call: Refill Medication Summary of Call: Patient came into the office requesting [place] to send a prescription for OneTouch strips to [facility] on [address]. Initial call taken by: Irma Newness,  April 29, 2009 10:42 AM    New/Updated Medications: ONETOUCH TEST  STRP (GLUCOSE BLOOD) test as directed Prescriptions: ONETOUCH TEST  STRP (GLUCOSE BLOOD) test as directed  #100 x 5   Entered by:   Rock Nephew CMA   Authorized by:   Etta Grandchild MD   Signed by:   Rock Nephew CMA on 04/29/2009   Method used:   Electronically to        [facility]. [phone]* (retail)       [phone] [address]       [place], [place]  [address]       Ph: [phone]       Fax: [phone]   RxID:   [id number]

## 2010-05-26 NOTE — Letter (Signed)
Summary: [facility] Instructions  [facility]  [address] [place], [place] [address]   Phone: [phone]  Fax: [phone]       Jennifer Baker    26-Jun-1950    MRN: [id number]        Procedure Day /Date: 04/01/10  Wednesday     Arrival Time:  9:30am      Procedure Time:  10:30am     Location of Procedure:                    _ x_  [facility] (4th Floor)                        PREPARATION FOR COLONOSCOPY WITH MOVIPREP   Starting 5 days prior to your procedure _12/2/11 _ do not eat nuts, seeds, popcorn, corn, beans, peas,  salads, or any raw vegetables.  Do not take any fiber supplements (e.g. Metamucil, Citrucel, and Benefiber).  THE DAY BEFORE YOUR PROCEDURE         DATE:  03/31/10   DAY:   Tuesday  1.  Drink clear liquids the entire day-NO SOLID FOOD  2.  Do not drink anything colored red or purple.  Avoid juices with pulp.  No orange juice.  3.  Drink at least 64 oz. (8 glasses) of fluid/clear liquids during the day to prevent dehydration and help the prep work efficiently.  CLEAR LIQUIDS INCLUDE: Water Jello Ice Popsicles Tea (sugar ok, no milk/cream) Powdered fruit flavored drinks Coffee (sugar ok, no milk/cream) Gatorade Juice: apple, white grape, white cranberry  Lemonade Clear bullion, consomm, broth Carbonated beverages (any kind) Strained chicken noodle soup Hard Candy                             4.  In the morning, mix first dose of MoviPrep solution:    Empty 1 Pouch A and 1 Pouch B into the disposable container    Add lukewarm drinking water to the top line of the container. Mix to dissolve    Refrigerate (mixed solution should be used within 24 hrs)  5.  Begin drinking the prep at 5:00 p.m. The MoviPrep container is divided by 4 marks.   Every 15 minutes drink the solution down to the next mark (approximately 8 oz) until the full liter is complete.   6.  Follow completed prep with 16 oz of clear liquid of your choice  (Nothing red or purple).  Continue to drink clear liquids until bedtime.  7.  Before going to bed, mix second dose of MoviPrep solution:    Empty 1 Pouch A and 1 Pouch B into the disposable container    Add lukewarm drinking water to the top line of the container. Mix to dissolve    Refrigerate  THE DAY OF YOUR PROCEDURE      DATE:  04/01/10  DAY:  Wednesday  Beginning at _ 5:30 a.m. (5 hours before procedure):         1. Every 15 minutes, drink the solution down to the next mark (approx 8 oz) until the full liter is complete.  2. Follow completed prep with 16 oz. of clear liquid of your choice.    3. You may drink clear liquids until  8:30am  (2 HOURS BEFORE PROCEDURE).   MEDICATION INSTRUCTIONS  Unless otherwise instructed, you should take regular prescription medications with a small  sip of water   as early as possible the morning of your procedure.  Diabetic patients - see separate instructions.         OTHER INSTRUCTIONS  You will need a responsible adult at least 61 years of age to accompany you and drive you home.   This person must remain in the waiting room during your procedure.  Wear loose fitting clothing that is easily removed.  Leave jewelry and other valuables at home.  However, you may wish to bring a book to read or  an iPod/MP3 player to listen to music as you wait for your procedure to start.  Remove all body piercing jewelry and leave at home.  Total time from sign-in until discharge is approximately 2-3 hours.  You should go home directly after your procedure and rest.  You can resume normal activities the  day after your procedure.  The day of your procedure you should not:   Drive   Make legal decisions   Operate machinery   Drink alcohol   Return to work  You will receive specific instructions about eating, activities and medications before you leave.    The above instructions have been reviewed and explained to me by   Ulis Rias RN  March 24, 2010 4:27 PM     I fully understand and can verbalize these instructions _____________________________ Date _________

## 2010-05-26 NOTE — Letter (Signed)
Summary: [facility]  [facility]   Imported By: Lester Ensign 02/20/2010 08:46:53  _____________________________________________________________________  External Attachment:    Type:   Image     Comment:   External Document

## 2010-05-26 NOTE — Miscellaneous (Signed)
Summary: [facility] previsit  Clinical Lists Changes  Medications: Added new medication of MOVIPREP 100 GM  SOLR (PEG-KCL-NACL-NASULF-NA ASC-C) As per prep instructions. - Signed Rx of MOVIPREP 100 GM  SOLR (PEG-KCL-NACL-NASULF-NA ASC-C) As per prep instructions.;  #1 x 0;  Signed;  Entered by: Ulis Rias RN;  Authorized by: Rachael Fee MD;  Method used: Electronically to [facility] [phone]*, [address], [place], [place]  [address], Ph: [phone], Fax: [phone] Observations: Added new observation of NKA: T (03/24/2010 14:55)    Prescriptions: MOVIPREP 100 GM  SOLR (PEG-KCL-NACL-NASULF-NA ASC-C) As per prep instructions.  #1 x 0   Entered by:   Ulis Rias RN   Authorized by:   Rachael Fee MD   Signed by:   Ulis Rias RN on 03/24/2010   Method used:   Electronically to        [facility] [phone]* (retail)       [address]       [place], [place]  [address]       Ph: [phone]       Fax: [phone]   RxID:   [id number]

## 2010-05-26 NOTE — Letter (Signed)
Summary: Results Follow-up Letter  [facility]  [address] [place], [place] [address]   Phone: [phone]  Fax: [phone]    04/30/2009  [address] [place], [place]  [address]  Dear Jennifer Baker,   The following are the results of your recent test(s):  Test     Result     Blood sugar     169 A1C       7.5, too high Liver       one, mild enzyme elevation Kidney     normal CBC       normal Thyroid     normal   _________________________________________________________  Please call for an appointment as directed _________________________________________________________ _________________________________________________________ _________________________________________________________  Sincerely,  Sanda Linger MD [facility]

## 2010-05-26 NOTE — Assessment & Plan Note (Signed)
Summary: f/u appt/#/cd   Vital Signs:  Patient profile:   60 year old female Menstrual status:  hysterectomy Height:      60 inches Weight:      151 pounds BMI:     29.60 O2 Sat:      98 % on Room air Temp:     97.5 degrees F oral Pulse rate:   88 / minute Pulse rhythm:   regular Resp:     16 per minute BP sitting:   118 / 64  (left arm) Cuff size:   regular  Vitals Entered By: Lanier Prude, Beverly Gust) (March 25, 2010 1:26 PM)  Nutrition Counseling: Patient's BMI is greater than 25 and therefore counseled on weight management options.  O2 Flow:  Room air CC: f/u , Lipid Management Comments pt is not taking Robaxin, Dilaudid or Vit E     Menstrual Status hysterectomy Last PAP Result NEGATIVE FOR INTRAEPITHELIAL LESIONS OR MALIGNANCY.   Primary Care Provider:  Etta Grandchild MD  CC:  f/u  and Lipid Management.  History of Present Illness:  Follow-Up Visit      This is a 60 year old woman who presents for Follow-up visit.  The patient denies chest pain, palpitations, dizziness, syncope, low blood sugar symptoms, high blood sugar symptoms, edema, SOB, DOE, PND, and orthopnea.  Since the last visit the patient notes no new problems or concerns.  The patient reports taking meds as prescribed, monitoring BP, monitoring blood sugars, and dietary noncompliance.  When questioned about possible medication side effects, the patient notes none.    Lipid Management History:      Positive NCEP/ATP III risk factors include female age 49 years old or older, diabetes, HDL cholesterol less than 40, and hypertension.  Negative NCEP/ATP III risk factors include no family history for ischemic heart disease, non-tobacco-user status, no ASHD (atherosclerotic heart disease), no prior stroke/TIA, no peripheral vascular disease, and no history of aortic aneurysm.        The patient states that she knows about the "Therapeutic Lifestyle Change" diet.  Her compliance with the TLC diet is fair.  The  patient expresses understanding of adjunctive measures for cholesterol lowering.  Adjunctive measures started by the patient include fiber and limit alcohol consumpton.  She expresses no side effects from her lipid-lowering medication.  The patient denies any symptoms to suggest myopathy or liver disease.    Preventive Screening-Counseling & Management  Alcohol-Tobacco     Alcohol drinks/day: 0     Alcohol Counseling: not indicated; patient does not drink     Smoking Status: never     Tobacco Counseling: not indicated; no tobacco use  Hep-HIV-STD-Contraception     Hepatitis Risk: no risk noted     HIV Risk: no risk noted     STD Risk: no risk noted     SBE monthly: yes     SBE Education/Counseling: to perform regular SBE      Sexual History:  currently monogamous.        Drug Use:  never.        Blood Transfusions:  no.    Clinical Review Panels:  Prevention   Last Mammogram:  ASSESSMENT: Negative - BI-RADS 1^MM DIGITAL SCREENING (05/12/2009)   Last Pap Smear:  NEGATIVE FOR INTRAEPITHELIAL LESIONS OR MALIGNANCY. (01/06/2010)   Last Colonoscopy:  Adenomatous Polyp (02/15/2007)  Immunizations   Last Tetanus Booster:  Tdap (01/06/2010)   Last Flu Vaccine:  Fluvax 3+ (01/06/2010)   Last Pneumovax:  Historical (01/24/2009)  Lipid Management   Cholesterol:  125 (01/06/2010)   LDL (bad choesterol):  72 (01/06/2010)   HDL (good cholesterol):  32.00 (01/06/2010)  Diabetes Management   HgBA1C:  7.6 (01/06/2010)   Creatinine:  0.6 (01/06/2010)   Last Dilated Eye Exam:  normal (06/16/2009)   Last Foot Exam:  yes (03/25/2010)   Last Flu Vaccine:  Fluvax 3+ (01/06/2010)   Last Pneumovax:  Historical (01/24/2009)  CBC   WBC:  4.1 (01/06/2010)   RBC:  4.23 (01/06/2010)   Hgb:  12.9 (01/06/2010)   Hct:  38.0 (01/06/2010)   Platelets:  168.0 (01/06/2010)   MCV  90.0 (01/06/2010)   MCHC  34.0 (01/06/2010)   RDW  13.1 (01/06/2010)   PMN:  50.7 (01/06/2010)   Lymphs:  39.0  (01/06/2010)   Monos:  5.8 (01/06/2010)   Eosinophils:  3.6 (01/06/2010)   Basophil:  0.9 (01/06/2010)  Complete Metabolic Panel   Glucose:  101 (01/06/2010)   Sodium:  143 (01/06/2010)   Potassium:  3.9 (01/06/2010)   Chloride:  106 (01/06/2010)   CO2:  28 (01/06/2010)   BUN:  16 (01/06/2010)   Creatinine:  0.6 (01/06/2010)   Albumin:  4.4 (01/06/2010)   Total Protein:  6.8 (01/06/2010)   Calcium:  9.8 (01/06/2010)   Total Bili:  0.5 (01/06/2010)   Alk Phos:  51 (01/06/2010)   SGPT (ALT):  37 (01/06/2010)   SGOT (AST):  23 (01/06/2010)   Medications Prior to Update: 1)  Metformin Hcl 1000 Mg Tabs (Metformin Hcl) .... Take 1 Tablet By Mouth Two Times A Day 2)  Simvastatin 80 Mg Tabs (Simvastatin) .... Take 1 Tab By Mouth At Bedtime 3)  Lantus Solostar 100 Unit/ml Soln (Insulin Glargine) .... 33 Units At Bedtime 4)  Lisinopril 10 Mg Tabs (Lisinopril) .... Take 1 Tablet By Mouth Once A Day 5)  Colace .... At Bedtime As Needed 6)  Asa 81mg  7)  Vitamin C 1000 Mg Tabs (Ascorbic Acid) 8)  Onetouch Test  Strp (Glucose Blood) .... Test As Directed 9)  Robaxin 500 Mg Tabs (Methocarbamol) 10)  Naproxen 500 Mg Tabs (Naproxen) .... Take 1 Tablet By Mouth Once A Day 11)  Dilaudid 2 Mg Tabs (Hydromorphone Hcl) .Marland Kitchen.. 1-2 Every 4-6 Hours Prn 12)  Temazepam 30 Mg Caps (Temazepam) .... Take 1 Tab By Mouth At Bedtime 13)  Co-Q 100mg  14)  Vitamin E .... Daily 15)  Vit D 16)  Multiple Vitamin 17)  Bd Pen Needles Short 31gx 5/16 .... Test Two Times A Day 18)  Moviprep 100 Gm  Solr (Peg-Kcl-Nacl-Nasulf-Na Asc-C) .... As Per Prep Instructions.  Current Medications (verified): 1)  Metformin Hcl 1000 Mg Tabs (Metformin Hcl) .... Take 1 Tablet By Mouth Two Times A Day 2)  Simvastatin 80 Mg Tabs (Simvastatin) .... Take 1 Tab By Mouth At Bedtime 3)  Lantus Solostar 100 Unit/ml Soln (Insulin Glargine) .... 33 Units At Bedtime 4)  Lisinopril 10 Mg Tabs (Lisinopril) .... Take 1 Tablet By Mouth Once A  Day 5)  Colace .... At Bedtime As Needed 6)  Asa 81mg  7)  Vitamin C 1000 Mg Tabs (Ascorbic Acid) 8)  Onetouch Test  Strp (Glucose Blood) .... Test As Directed 9)  Naproxen 500 Mg Tabs (Naproxen) .... Take 1 Tablet By Mouth Once A Day 10)  Temazepam 30 Mg Caps (Temazepam) .... Take 1 Tab By Mouth At Bedtime 11)  Co-Q 100mg  12)  Vit D 13)  Multiple Vitamin 14)  Bd Pen Needles  Short 31gx 5/16 .... Test Two Times A Day 15)  Moviprep 100 Gm  Solr (Peg-Kcl-Nacl-Nasulf-Na Asc-C) .... As Per Prep Instructions. 16)  Aspir-Low 81 Mg Tbec (Aspirin) .Marland Kitchen.. 1 By Mouth Once Daily  Allergies (verified): No Known Drug Allergies  Past History:  Past Medical History: Last updated: 04/24/2009 Diabetes mellitus, type II Hyperlipidemia Hypertension Colonic polyps, hx of  Past Surgical History: Last updated: 04/24/2009 Carpal tunnel release Hysterectomy Rotator cuff repair  Family History: Last updated: 04/24/2009 Family History of Alcoholism/Addiction Family History Diabetes 1st degree relative Family History High cholesterol Family History Hypertension Family History Kidney disease Family History of Stroke F 1st degree relative <60  Social History: Last updated: 04/24/2009 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 Family History of Alcoholism/Addiction Family History Diabetes 1st degree relative Family History High cholesterol Family History Hypertension Family History Kidney disease Family History of Stroke F 1st degree relative <60  Social History: Reviewed history from 04/24/2009 and no changes required. 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  The patient denies anorexia, fever, weight loss, chest pain, syncope, dyspnea on exertion, peripheral edema, prolonged cough, headaches, hemoptysis, abdominal pain, hematuria, suspicious skin lesions, difficulty walking, enlarged lymph nodes, and angioedema.   GI:  Denies abdominal pain, change in bowel habits, constipation, diarrhea, indigestion, loss of appetite, nausea, vomiting, and yellowish skin color. Endo:  Denies cold intolerance, excessive hunger, excessive thirst, excessive urination, heat intolerance, polyuria, and weight change.  Physical Exam  General:  alert, well-developed, well-nourished, well-hydrated, appropriate dress, normal appearance, healthy-appearing, cooperative to examination, good hygiene, and overweight-appearing.   Head:  normocephalic, atraumatic, no abnormalities observed, and no abnormalities palpated.   Eyes:  vision grossly intact and no injection or icterus. Mouth:  Oral mucosa and oropharynx without lesions or exudates.  Teeth in good repair. Neck:  No deformities, masses, or tenderness noted. Lungs:  normal respiratory effort, no intercostal retractions, no accessory muscle use, normal breath sounds, no dullness, no fremitus, no crackles, and no wheezes.   Heart:  normal rate, regular rhythm, no murmur, no gallop, no rub, and no JVD.   Abdomen:  soft, non-tender, normal bowel sounds, no distention, no masses, no guarding, no rigidity, no rebound tenderness, no abdominal hernia, no inguinal hernia, no hepatomegaly, no splenomegaly, and abdominal scar(s).   Msk:  normal ROM, no joint tenderness, no joint swelling, no joint warmth, no redness over joints, no joint deformities, no joint instability, and no crepitation.   Pulses:  R and L carotid,radial,femoral,dorsalis pedis and posterior tibial pulses are full and equal bilaterally Extremities:  No clubbing, cyanosis, edema, or deformity noted with normal full range of motion of  all joints.   Neurologic:  No cranial nerve deficits noted. Station and gait are normal. Plantar reflexes are down-going bilaterally. DTRs are symmetrical throughout. Sensory, motor and coordinative functions appear intact. Skin:  Intact without suspicious lesions or rashes Cervical Nodes:  no anterior cervical adenopathy and no posterior cervical adenopathy.   Axillary Nodes:  no R axillary adenopathy and no L axillary adenopathy.   Inguinal Nodes:  no R inguinal adenopathy and no L inguinal adenopathy.   Psych:  Cognition and judgment appear intact. Alert and  cooperative with normal attention span and concentration. No apparent delusions, illusions, hallucinations  Diabetes Management Exam:    Foot Exam (with socks and/or shoes not present):       Sensory-Pinprick/Light touch:          Left medial foot (L-4): normal          Left dorsal foot (L-5): normal          Left lateral foot (S-1): normal          Right medial foot (L-4): normal          Right dorsal foot (L-5): normal          Right lateral foot (S-1): normal       Sensory-Monofilament:          Left foot: normal          Right foot: normal       Inspection:          Left foot: normal          Right foot: normal       Nails:          Left foot: normal          Right foot: normal   Impression & Recommendations:  Problem # 1:  FATTY LIVER DISEASE (ICD-571.8) Assessment Unchanged  Orders: Venipuncture (16109) TLB-Lipid Panel (80061-LIPID) TLB-BMP (Basic Metabolic Panel-BMET) (80048-METABOL) TLB-CBC Platelet - w/Differential (85025-CBCD) TLB-Hepatic/Liver Function Pnl (80076-HEPATIC) TLB-TSH (Thyroid Stimulating Hormone) (84443-TSH) TLB-A1C / Hgb A1C (Glycohemoglobin) (83036-A1C)  Problem # 2:  HYPERTENSION (ICD-401.9) Assessment: Improved  Her updated medication list for this problem includes:    Lisinopril 10 Mg Tabs (Lisinopril) .Marland Kitchen... Take 1 tablet by mouth once a day  Orders: Venipuncture (60454) TLB-Lipid  Panel (80061-LIPID) TLB-BMP (Basic Metabolic Panel-BMET) (80048-METABOL) TLB-CBC Platelet - w/Differential (85025-CBCD) TLB-Hepatic/Liver Function Pnl (80076-HEPATIC) TLB-TSH (Thyroid Stimulating Hormone) (84443-TSH) TLB-A1C / Hgb A1C (Glycohemoglobin) (83036-A1C)  BP today: 118/64 Prior BP: 110/64 (01/06/2010)  Prior 10 Yr Risk Heart Disease: 9 % (07/22/2009)  Labs Reviewed: K+: 3.9 (01/06/2010) Creat: : 0.6 (01/06/2010)   Chol: 125 (01/06/2010)   HDL: 32.00 (01/06/2010)   LDL: 72 (01/06/2010)   TG: 104.0 (01/06/2010)  Problem # 3:  HYPERLIPIDEMIA (ICD-272.4)  Her updated medication list for this problem includes:    Simvastatin 80 Mg Tabs (Simvastatin) .Marland Kitchen... Take 1 tab by mouth at bedtime  Orders: Venipuncture (09811) TLB-Lipid Panel (80061-LIPID) TLB-BMP (Basic Metabolic Panel-BMET) (80048-METABOL) TLB-CBC Platelet - w/Differential (85025-CBCD) TLB-Hepatic/Liver Function Pnl (80076-HEPATIC) TLB-TSH (Thyroid Stimulating Hormone) (84443-TSH) TLB-A1C / Hgb A1C (Glycohemoglobin) (83036-A1C)  Labs Reviewed: SGOT: 23 (01/06/2010)   SGPT: 37 (01/06/2010)  Lipid Goals: Chol Goal: 200 (04/24/2009)   HDL Goal: 40 (04/24/2009)   LDL Goal: 100 (04/24/2009)   TG Goal: 150 (04/24/2009)  Prior 10 Yr Risk Heart Disease: 9 % (07/22/2009)   HDL:32.00 (01/06/2010), 42.80 (09/23/2009)  LDL:72 (01/06/2010), 85 (09/23/2009)  Chol:125 (01/06/2010), 162 (09/23/2009)  Trig:104.0 (01/06/2010), 170.0 (09/23/2009)  Problem # 4:  DIABETES MELLITUS, TYPE II (ICD-250.00)  Her updated medication list for this problem includes:    Metformin Hcl 1000 Mg Tabs (Metformin hcl) .Marland Kitchen... Take 1 tablet by mouth two times a day    Lantus Solostar 100 Unit/ml Soln (Insulin glargine) .Marland Kitchen... 33 units at bedtime    Lisinopril 10 Mg Tabs (Lisinopril) .Marland Kitchen... Take 1 tablet by mouth once a day    Aspir-low 81 Mg Tbec (Aspirin) .Marland Kitchen... 1 by mouth once daily  Orders: Venipuncture ([id number]) TLB-Lipid Panel  (80061-LIPID)  TLB-BMP (Basic Metabolic Panel-BMET) (80048-METABOL) TLB-CBC Platelet - w/Differential (85025-CBCD) TLB-Hepatic/Liver Function Pnl (80076-HEPATIC) TLB-TSH (Thyroid Stimulating Hormone) (84443-TSH) TLB-A1C / Hgb A1C (Glycohemoglobin) (83036-A1C)  Complete Medication List: 1)  Metformin Hcl 1000 Mg Tabs (Metformin hcl) .... Take 1 tablet by mouth two times a day 2)  Simvastatin 80 Mg Tabs (Simvastatin) .... Take 1 tab by mouth at bedtime 3)  Lantus Solostar 100 Unit/ml Soln (Insulin glargine) .... 33 units at bedtime 4)  Lisinopril 10 Mg Tabs (Lisinopril) .... Take 1 tablet by mouth once a day 5)  Colace  .... At bedtime as needed 6)  Asa 81mg   7)  Vitamin C 1000 Mg Tabs (Ascorbic acid) 8)  Onetouch Test Strp (Glucose blood) .... Test as directed 9)  Naproxen 500 Mg Tabs (Naproxen) .... Take 1 tablet by mouth once a day 10)  Temazepam 30 Mg Caps (Temazepam) .... Take 1 tab by mouth at bedtime 11)  Co-q 100mg   12)  Vit D  13)  Multiple Vitamin  14)  Bd Pen Needles Short 31gx 5/16  .... Test two times a day 15)  Moviprep 100 Gm Solr (Peg-kcl-nacl-nasulf-na asc-c) .... As per prep instructions. 16)  Aspir-low 81 Mg Tbec (Aspirin) .Marland Kitchen.. 1 by mouth once daily  Lipid Assessment/Plan:      Based on NCEP/ATP III, the patient's risk factor category is "history of diabetes".  The patient's lipid goals are as follows: Total cholesterol goal is 200; LDL cholesterol goal is 100; HDL cholesterol goal is 40; Triglyceride goal is 150.     Patient Instructions: 1)  Please schedule a follow-up appointment in 3 months. 2)  It is important that you exercise regularly at least 20 minutes 5 times a week. If you develop chest pain, have severe difficulty breathing, or feel very tired , stop exercising immediately and seek medical attention. 3)  You need to lose weight. Consider a lower calorie diet and regular exercise.  4)  Check your blood sugars regularly. If your readings are usually above  200 or below 70 you should contact our office. 5)  It is important that your Diabetic A1c level is checked every 3 months. 6)  See your eye doctor yearly to check for diabetic eye damage. 7)  Check your feet each night for sore areas, calluses or signs of infection. 8)  Check your Blood Pressure regularly. If it is above 130/80: you should make an appointment.   Orders Added: 1)  Venipuncture [[id number]] 2)  TLB-Lipid Panel [80061-LIPID] 3)  TLB-BMP (Basic Metabolic Panel-BMET) [80048-METABOL] 4)  TLB-CBC Platelet - w/Differential [85025-CBCD] 5)  TLB-Hepatic/Liver Function Pnl [80076-HEPATIC] 6)  TLB-TSH (Thyroid Stimulating Hormone) [84443-TSH] 7)  TLB-A1C / Hgb A1C (Glycohemoglobin) [83036-A1C] 8)  Est. Patient Level IV [16109]

## 2010-05-26 NOTE — Letter (Signed)
Summary: [facility]   Imported By: Lester [place] 06/26/2009 08:27:33  _____________________________________________________________________  External Attachment:    Type:   Image     Comment:   External Document

## 2010-05-26 NOTE — Assessment & Plan Note (Signed)
Summary: 2 MTH FU  STC   Vital Signs:  Patient profile:   60 year old female Height:      60 inches Weight:      150.75 pounds BMI:     29.55 O2 Sat:      96 % on Room air Temp:     97.0 degrees F oral Pulse rate:   60 / minute Pulse rhythm:   regular Resp:     16 per minute BP sitting:   110 / 70  (left arm) Cuff size:   large  Vitals Entered By: Rock Nephew CMA (Sep 23, 2009 9:50 AM)  Nutrition Counseling: Patient's BMI is greater than 25 and therefore counseled on weight management options.  O2 Flow:  Room air CC: follow-up visit 53mo Is Patient Diabetic? Yes Did you bring your meter with you today? No Pain Assessment Patient in pain? no        Primary Care Provider:  Etta Grandchild MD  CC:  follow-up visit 53mo.  History of Present Illness:  Follow-Up Visit      This is a 60 year old woman who presents for Follow-up visit.  The patient denies chest pain, palpitations, dizziness, syncope, low blood sugar symptoms, high blood sugar symptoms, edema, SOB, DOE, PND, and orthopnea.  Since the last visit the patient notes no new problems or concerns.  The patient reports taking meds as prescribed, monitoring BP, monitoring blood sugars, and dietary compliance.  When questioned about possible medication side effects, the patient notes none.    Preventive Screening-Counseling & Management  Alcohol-Tobacco     Alcohol drinks/day: 0     Smoking Status: never  Hep-HIV-STD-Contraception     Hepatitis Risk: no risk noted     HIV Risk: no risk noted     STD Risk: no risk noted     SBE monthly: yes     SBE Education/Counseling: to perform regular SBE      Sexual History:  currently monogamous.        Drug Use:  no.        Blood Transfusions:  no.    Clinical Review Panels:  Lipid Management   Cholesterol:  145 (04/24/2009)   LDL (bad choesterol):  82 (04/24/2009)   HDL (good cholesterol):  35.90 (04/24/2009)  Diabetes Management   HgBA1C:  7.5 (04/24/2009)  Creatinine:  0.6 (04/24/2009)   Last Dilated Eye Exam:  normal (06/16/2009)   Last Foot Exam:  yes (09/23/2009)   Last Pneumovax:  Historical (01/24/2009)  CBC   WBC:  4.2 (04/24/2009)   RBC:  4.66 (04/24/2009)   Hgb:  14.0 (04/24/2009)   Hct:  41.2 (04/24/2009)   Platelets:  178.0 (04/24/2009)   MCV  88.2 (04/24/2009)   MCHC  34.1 (04/24/2009)   RDW  12.7 (04/24/2009)   PMN:  46.9 (04/24/2009)   Lymphs:  39.8 (04/24/2009)   Monos:  6.4 (04/24/2009)   Eosinophils:  5.0 (04/24/2009)   Basophil:  1.9 (04/24/2009)  Complete Metabolic Panel   Glucose:  169 (04/24/2009)   Sodium:  140 (04/24/2009)   Potassium:  3.8 (04/24/2009)   Chloride:  105 (04/24/2009)   CO2:  25 (04/24/2009)   BUN:  13 (04/24/2009)   Creatinine:  0.6 (04/24/2009)   Albumin:  4.3 (04/24/2009)   Total Protein:  7.6 (04/24/2009)   Calcium:  9.6 (04/24/2009)   Total Bili:  1.0 (04/24/2009)   Alk Phos:  54 (04/24/2009)   SGPT (ALT):  43 (04/24/2009)  SGOT (AST):  26 (04/24/2009)   Medications Prior to Update: 1)  Metformin Hcl 1000 Mg Tabs (Metformin Hcl) .... Take 1 Tablet By Mouth Two Times A Day 2)  Simvastatin 80 Mg Tabs (Simvastatin) .... Take 1 Tab By Mouth At Bedtime 3)  Lantus Solostar 100 Unit/ml Soln (Insulin Glargine) .... 33 Units At Bedtime 4)  Lisinopril 10 Mg Tabs (Lisinopril) .... Take 1 Tablet By Mouth Once A Day 5)  Colace .... At Bedtime As Needed 6)  Asa 81mg  7)  Vitamin C 1000 Mg Tabs (Ascorbic Acid) 8)  Onetouch Test  Strp (Glucose Blood) .... Test As Directed 9)  Robaxin 500 Mg Tabs (Methocarbamol) 10)  Naproxen 500 Mg Tabs (Naproxen) .... Take 1 Tablet By Mouth Once A Day 11)  Dilaudid 2 Mg Tabs (Hydromorphone Hcl) .Marland Kitchen.. 1-2 Every 4-6 Hours Prn 12)  Temazepam 30 Mg Caps (Temazepam) .... Take 1 Tab By Mouth At Bedtime 13)  Co-Q 100mg   Current Medications (verified): 1)  Metformin Hcl 1000 Mg Tabs (Metformin Hcl) .... Take 1 Tablet By Mouth Two Times A Day 2)  Simvastatin 80 Mg  Tabs (Simvastatin) .... Take 1 Tab By Mouth At Bedtime 3)  Lantus Solostar 100 Unit/ml Soln (Insulin Glargine) .... 33 Units At Bedtime 4)  Lisinopril 10 Mg Tabs (Lisinopril) .... Take 1 Tablet By Mouth Once A Day 5)  Colace .... At Bedtime As Needed 6)  Asa 81mg  7)  Vitamin C 1000 Mg Tabs (Ascorbic Acid) 8)  Onetouch Test  Strp (Glucose Blood) .... Test As Directed 9)  Robaxin 500 Mg Tabs (Methocarbamol) 10)  Naproxen 500 Mg Tabs (Naproxen) .... Take 1 Tablet By Mouth Once A Day 11)  Dilaudid 2 Mg Tabs (Hydromorphone Hcl) .Marland Kitchen.. 1-2 Every 4-6 Hours Prn 12)  Temazepam 30 Mg Caps (Temazepam) .... Take 1 Tab By Mouth At Bedtime 13)  Co-Q 100mg   Allergies (verified): No Known Drug Allergies  Past History:  Past Medical History: Reviewed history from 04/24/2009 and no changes required. Diabetes mellitus, type II Hyperlipidemia Hypertension Colonic polyps, hx of  Past Surgical History: Reviewed history from 04/24/2009 and no changes required. Carpal tunnel release Hysterectomy Rotator cuff repair  Family History: Reviewed history from 04/24/2009 and no changes required. Family History of Alcoholism/Addiction Family History Diabetes 1st degree relative Family History High cholesterol Family History Hypertension Family History Kidney disease Family History of Stroke F 1st degree relative <60  Social History: Reviewed history from 04/24/2009 and no changes required. Retired RN/ Disabled Married Never Smoked Alcohol use-no Drug use-no Regular exercise-no  Review of Systems       The patient complains of weight gain.  The patient denies anorexia, fever, weight loss, chest pain, syncope, dyspnea on exertion, peripheral edema, prolonged cough, headaches, hemoptysis, abdominal pain, hematuria, difficulty walking, depression, and enlarged lymph nodes.   GI:  Denies abdominal pain, change in bowel habits, diarrhea, excessive appetite, indigestion, loss of appetite, nausea,  vomiting, vomiting blood, and yellowish skin color.  Physical Exam  General:  alert, well-developed, well-nourished, well-hydrated, appropriate dress, normal appearance, healthy-appearing, cooperative to examination, good hygiene, and overweight-appearing.   Head:  normocephalic, atraumatic, no abnormalities observed, and no abnormalities palpated.   Mouth:  Oral mucosa and oropharynx without lesions or exudates.  Teeth in good repair. Neck:  No deformities, masses, or tenderness noted. Lungs:  Normal respiratory effort, chest expands symmetrically. Lungs are clear to auscultation, no crackles or wheezes. Heart:  Normal rate and regular rhythm. S1 and S2 normal without gallop,  murmur, click, rub or other extra sounds. Abdomen:  soft, non-tender, normal bowel sounds, no distention, no masses, no guarding, no rigidity, no rebound tenderness, no inguinal hernia, no hepatomegaly, and no splenomegaly.   Msk:  No deformity or scoliosis noted of thoracic or lumbar spine.   Pulses:  R and L carotid,radial,femoral,dorsalis pedis and posterior tibial pulses are full and equal bilaterally Extremities:  No clubbing, cyanosis, edema, or deformity noted with normal full range of motion of all joints.   Neurologic:  No cranial nerve deficits noted. Station and gait are normal. Plantar reflexes are down-going bilaterally. DTRs are symmetrical throughout. Sensory, motor and coordinative functions appear intact. Skin:  Intact without suspicious lesions or rashes Cervical Nodes:  no anterior cervical adenopathy and no posterior cervical adenopathy.   Axillary Nodes:  no R axillary adenopathy and no L axillary adenopathy.   Psych:  Cognition and judgment appear intact. Alert and cooperative with normal attention span and concentration. No apparent delusions, illusions, hallucinations  Diabetes Management Exam:    Foot Exam (with socks and/or shoes not present):       Sensory-Pinprick/Light touch:          Left  medial foot (L-4): normal          Left dorsal foot (L-5): normal          Left lateral foot (S-1): normal          Right medial foot (L-4): normal          Right dorsal foot (L-5): normal          Right lateral foot (S-1): normal       Sensory-Monofilament:          Left foot: normal          Right foot: normal       Inspection:          Left foot: normal          Right foot: normal       Nails:          Left foot: normal          Right foot: normal   Impression & Recommendations:  Problem # 1:  HYPERTENSION (ICD-401.9) Assessment Unchanged  Her updated medication list for this problem includes:    Lisinopril 10 Mg Tabs (Lisinopril) .Marland Kitchen... Take 1 tablet by mouth once a day  Orders: Venipuncture (29562) TLB-Lipid Panel (80061-LIPID) TLB-BMP (Basic Metabolic Panel-BMET) (80048-METABOL) TLB-CBC Platelet - w/Differential (85025-CBCD) TLB-Hepatic/Liver Function Pnl (80076-HEPATIC) TLB-A1C / Hgb A1C (Glycohemoglobin) (83036-A1C)  BP today: 110/70 Prior BP: 98/60 (07/22/2009)  Prior 10 Yr Risk Heart Disease: 9 % (07/22/2009)  Labs Reviewed: K+: 3.8 (04/24/2009) Creat: : 0.6 (04/24/2009)   Chol: 145 (04/24/2009)   HDL: 35.90 (04/24/2009)   LDL: 82 (04/24/2009)   TG: 138.0 (04/24/2009)  Problem # 2:  HYPERLIPIDEMIA (ICD-272.4) Assessment: Unchanged  Her updated medication list for this problem includes:    Simvastatin 80 Mg Tabs (Simvastatin) .Marland Kitchen... Take 1 tab by mouth at bedtime  Orders: Venipuncture (13086) TLB-Lipid Panel (80061-LIPID) TLB-BMP (Basic Metabolic Panel-BMET) (80048-METABOL) TLB-CBC Platelet - w/Differential (85025-CBCD) TLB-Hepatic/Liver Function Pnl (80076-HEPATIC) TLB-A1C / Hgb A1C (Glycohemoglobin) (83036-A1C)  Labs Reviewed: SGOT: 26 (04/24/2009)   SGPT: 43 (04/24/2009)  Lipid Goals: Chol Goal: 200 (04/24/2009)   HDL Goal: 40 (04/24/2009)   LDL Goal: 100 (04/24/2009)   TG Goal: 150 (04/24/2009)  Prior 10 Yr Risk Heart Disease: 9 %  (07/22/2009)   HDL:35.90 (04/24/2009)  LDL:82 (  04/24/2009)  Chol:145 (04/24/2009)  Trig:138.0 (04/24/2009)  Problem # 3:  DIABETES MELLITUS, TYPE II (ICD-250.00) Assessment: Unchanged  Her updated medication list for this problem includes:    Metformin Hcl 1000 Mg Tabs (Metformin hcl) .Marland Kitchen... Take 1 tablet by mouth two times a day    Lantus Solostar 100 Unit/ml Soln (Insulin glargine) .Marland Kitchen... 33 units at bedtime    Lisinopril 10 Mg Tabs (Lisinopril) .Marland Kitchen... Take 1 tablet by mouth once a day  Orders: Venipuncture (16109) TLB-Lipid Panel (80061-LIPID) TLB-BMP (Basic Metabolic Panel-BMET) (80048-METABOL) TLB-CBC Platelet - w/Differential (85025-CBCD) TLB-Hepatic/Liver Function Pnl (80076-HEPATIC) TLB-A1C / Hgb A1C (Glycohemoglobin) (83036-A1C)  Labs Reviewed: Creat: 0.6 (04/24/2009)     Last Eye Exam: normal (06/16/2009) Reviewed HgBA1c results: 7.5 (04/24/2009)  Problem # 4:  FATTY LIVER DISEASE (ICD-571.8) Assessment: Unchanged  Orders: Venipuncture (60454) TLB-Lipid Panel (80061-LIPID) TLB-BMP (Basic Metabolic Panel-BMET) (80048-METABOL) TLB-CBC Platelet - w/Differential (85025-CBCD) TLB-Hepatic/Liver Function Pnl (80076-HEPATIC) TLB-A1C / Hgb A1C (Glycohemoglobin) (83036-A1C)  Complete Medication List: 1)  Metformin Hcl 1000 Mg Tabs (Metformin hcl) .... Take 1 tablet by mouth two times a day 2)  Simvastatin 80 Mg Tabs (Simvastatin) .... Take 1 tab by mouth at bedtime 3)  Lantus Solostar 100 Unit/ml Soln (Insulin glargine) .... 33 units at bedtime 4)  Lisinopril 10 Mg Tabs (Lisinopril) .... Take 1 tablet by mouth once a day 5)  Colace  .... At bedtime as needed 6)  Asa 81mg   7)  Vitamin C 1000 Mg Tabs (Ascorbic acid) 8)  Onetouch Test Strp (Glucose blood) .... Test as directed 9)  Robaxin 500 Mg Tabs (Methocarbamol) 10)  Naproxen 500 Mg Tabs (Naproxen) .... Take 1 tablet by mouth once a day 11)  Dilaudid 2 Mg Tabs (Hydromorphone hcl) .Marland Kitchen.. 1-2 every 4-6 hours prn 12)   Temazepam 30 Mg Caps (Temazepam) .... Take 1 tab by mouth at bedtime 13)  Co-q 100mg    Patient Instructions: 1)  Please schedule a follow-up appointment in 3 months. 2)  It is important that you exercise regularly at least 20 minutes 5 times a week. If you develop chest pain, have severe difficulty breathing, or feel very tired , stop exercising immediately and seek medical attention. 3)  You need to lose weight. Consider a lower calorie diet and regular exercise.  4)  Check your blood sugars regularly. If your readings are usually above 200 or below 70 you should contact our office. 5)  It is important that your Diabetic A1c level is checked every 3 months. 6)  See your eye doctor yearly to check for diabetic eye damage. 7)  Check your feet each night for sore areas, calluses or signs of infection. 8)  Check your Blood Pressure regularly. If it is above 130/80: you should make an appointment.

## 2010-05-26 NOTE — Letter (Signed)
Summary: Results Follow-up Letter  [facility]  [address] [place], [place] [address]   Phone: [phone]  Fax: [phone]    01/07/2010  [address] [place], [place]  [address]  Dear [name],   The following are the results of your recent test(s):  Test     Result     Pap Smear    Normal__XX_____  Not Normal_____       Comments:none _________________________________________________________    _________________________________________________________  Please call for an appointment as directed _________________________________________________________ _________________________________________________________ _________________________________________________________  Sincerely,  Sanda Linger MD [facility]

## 2010-05-26 NOTE — Letter (Signed)
Summary: Pre Visit Letter Revised  [facility]  [address] [place], [place] [address]   Phone: [phone]  Fax: [phone]        03/09/2010 MRN: [id number] [facility] [address] [place], [place]  [address]             Procedure Date:  04-01-10  Welcome to the Gastroenterology Division at [facility].    You are scheduled to see a nurse for your pre-procedure visit on 03-24-10 at 4:00p.m. on the 3rd floor at [facility], [address]  We ask that you try to arrive at our office 15 minutes prior to your appointment time to allow for check-in.  Please take a minute to review the attached form.  If you answer "Yes" to one or more of the questions on the first page, we ask that you call the person listed at your earliest opportunity.  If you answer "No" to all of the questions, please complete the rest of the form and bring it to your appointment.    Your nurse visit will consist of discussing your medical and surgical history, your immediate family medical history, and your medications.   If you are unable to list all of your medications on the form, please bring the medication bottles to your appointment and we will list them.  We will need to be aware of both prescribed and over the counter drugs.  We will need to know exact dosage information as well.    Please be prepared to read and sign documents such as consent forms, a financial agreement, and acknowledgement forms.  If necessary, and with your consent, a friend or relative is welcome to sit-in on the nurse visit with you.  Please bring your insurance card so that we may make a copy of it.  If your insurance requires a referral to see a specialist, please bring your referral form from your primary care physician.  No co-pay is required for this nurse visit.     If you cannot keep your appointment, please call [phone] to cancel or reschedule prior to your appointment date.  This allows [place]  the opportunity to schedule an appointment for another patient in need of care.    Thank you for choosing [facility] for your medical needs.  We appreciate the opportunity to care for you.  Please visit [place] at our website  to learn more about our practice.  Sincerely, The Gastroenterology Division

## 2010-05-26 NOTE — Procedures (Signed)
Summary: ROI/Jack Chang,MD  ROI/Jack Chang,MD   Imported By: Lester Osino 05/01/2009 07:50:51  _____________________________________________________________________  External Attachment:    Type:   Image     Comment:   External Document

## 2010-05-26 NOTE — Procedures (Signed)
Summary: Colonoscopy  Patient: [name] Note: All result statuses are Final unless otherwise noted.  Tests: (1) Colonoscopy (COL)   COL Colonoscopy           DONE     [facility]     [address]     [place], [place]  [address]           COLONOSCOPY PROCEDURE REPORT           PATIENT:  Jennifer [place], Jennifer [place]  MR#:  [id number]     BIRTHDATE:  Mar 21, 1951, 59 yrs. old  GENDER:  female     ENDOSCOPIST:  Rachael Fee, MD     REF. BY:  Etta Grandchild, M.D.     PROCEDURE DATE:  04/01/2010     PROCEDURE:  Colonoscopy with snare polypectomy     ASA CLASS:  Class II     INDICATIONS:  "small polyp removed [place] 3 years ago."     MEDICATIONS:   Fentanyl 50 mcg IV, Versed 6 mg IV           DESCRIPTION OF PROCEDURE:   After the risks benefits and     alternatives of the procedure were thoroughly explained, informed     consent was obtained.  Digital rectal exam was performed and     revealed no rectal masses.   The LB [id number] endoscope     was introduced through the anus and advanced to the cecum, which     was identified by both the appendix and ileocecal valve, without     limitations.  The quality of the prep was good, using MoviPrep.     The instrument was then slowly withdrawn as the colon was fully     examined.     <<PROCEDUREIMAGES>>           FINDINGS:  A sessile polyp was found in the descending colon. This     was 3-9mm across, removed with cold snare and sent to pathology     (jar 1) (see image5).  Mild diverticulosis was found in the     sigmoid to descending colon segments (see image1).  This was     otherwise a normal examination of the colon (see image4, image3,     and image6).   Retroflexed views in the rectum revealed no     abnormalities.    The scope was then withdrawn from the patient     and the procedure completed.           COMPLICATIONS:  None     ENDOSCOPIC IMPRESSION:     1) Small sessile polyp in the descending colon, removed and sent   to pathology     2) Mild diverticulosis in the sigmoid to descending colon     segments     3) Otherwise normal examination           RECOMMENDATIONS:     1) If the polyp(s) removed today are proven to be adenomatous     (pre-cancerous) polyps, you will need a repeat colonoscopy in 5     years.     2) Dr. Christella Hartigan' office will try to locate colonoscopy and pathology     reports from your 2008 colonoscopy in [place].     2) You will receive a letter within 1-2 weeks with the results     of your biopsy as well as final recommendations. Please call my  office if you have not received a letter after 3 weeks.           ______________________________     Rachael Fee, MD           n.     eSIGNED:   Rachael Fee at 04/01/2010 10:24 AM           Candy Sledge, [id number]  Note: An exclamation mark (!) indicates a result that was not dispersed into the flowsheet. Document Creation Date: 04/01/2010 10:25 AM _______________________________________________________________________  (1) Order result status: Final Collection or observation date-time: 04/01/2010 10:17 Requested date-time:  Receipt date-time:  Reported date-time:  Referring Physician:   Ordering Physician: Rob Bunting [phone]) Specimen Source:  Source: Launa Grill Order Number: [id number] Lab site:   Appended Document: Colonoscopy left message on machine to call back   Appended Document: Colonoscopy pt states she filled out a release at her previsit I am going to track that down and send it in.  The info needed is phone [phone]  Fax [phone]  med rec number is [id number]  Sep 2008 Dr Noel Gerold  [address]   [place] [address]  [address]  Appended Document: Colonoscopy     Procedures Next Due Date:    Colonoscopy: 03/2015

## 2010-05-26 NOTE — Letter (Signed)
Summary: Lipid Letter  [facility]  [address] [place], [place] [address]   Phone: [phone]  Fax: [phone]    03/30/2010  Jaymi Tinner [address] [place], [place]  [address]  Dear Ms. Valladares:  We have carefully reviewed your last lipid profile from 03/25/2010 and the results are noted below with a summary of recommendations for lipid management.    Cholesterol:       138     Goal: <200   HDL "good" Cholesterol:   15.17     Goal: >40   LDL "bad" Cholesterol:   75     Goal: <100   Triglycerides:       143.0     Goal: <150        TLC Diet (Therapeutic Lifestyle Change): Saturated Fats & Transfatty acids should be kept < 7% of total calories ***Reduce Saturated Fats Polyunstaurated Fat can be up to 10% of total calories Monounsaturated Fat Fat can be up to 20% of total calories Total Fat should be no greater than 25-35% of total calories Carbohydrates should be 50-60% of total calories Protein should be approximately 15% of total calories Fiber should be at least 20-30 grams a day ***Increased fiber may help lower LDL Total Cholesterol should be < 200mg /day Consider adding plant stanol/sterols to diet (example: Benacol spread) ***A higher intake of unsaturated fat may reduce Triglycerides and Increase HDL    Adjunctive Measures (may lower LIPIDS and reduce risk of Heart Attack) include: Aerobic Exercise (20-30 minutes 3-4 times a week) Limit Alcohol Consumption Weight Reduction Aspirin 75-81 mg a day by mouth (if not allergic or contraindicated) Dietary Fiber 20-30 grams a day by mouth     Current Medications: 1)    Metformin Hcl 1000 Mg Tabs (Metformin hcl) .... Take 1 tablet by mouth two times a day 2)    Simvastatin 80 Mg Tabs (Simvastatin) .... Take 1 tab by mouth at bedtime 3)    Lantus Solostar 100 Unit/ml Soln (Insulin glargine) .... 33 units at bedtime 4)    Lisinopril 10 Mg Tabs (Lisinopril) .... Take 1 tablet by mouth once a day 5)     Colace  .... At bedtime as needed 6)    Asa 81mg   7)    Vitamin C 1000 Mg Tabs (Ascorbic acid) 8)    Onetouch Test  Strp (Glucose blood) .... Test as directed 9)    Naproxen 500 Mg Tabs (Naproxen) .... Take 1 tablet by mouth once a day 10)    Temazepam 30 Mg Caps (Temazepam) .... Take 1 tab by mouth at bedtime 11)    Co-q 100mg   12)    Vit D  13)    Multiple Vitamin  14)    Bd Pen Needles Short 31gx 5/16  .... Test two times a day 15)    Moviprep 100 Gm  Solr (Peg-kcl-nacl-nasulf-na asc-c) .... As per prep instructions. 16)    Aspir-low 81 Mg Tbec (Aspirin) .Marland Kitchen.. 1 by mouth once daily  If you have any questions, please call. We appreciate being able to work with you.   Sincerely,    [facility] Etta Grandchild MD

## 2010-05-26 NOTE — Letter (Signed)
Summary: Lipid Letter  [facility]  [address] [place], [place] [address]   Phone: [phone]  Fax: [phone]    09/23/2009  Jennifer Baker [address] [place], [place]  [address]  Dear Jennifer Baker:  We have carefully reviewed your last lipid profile from 09/23/2009 and the results are noted below with a summary of recommendations for lipid management.    Cholesterol:       162     Goal: <200   HDL "good" Cholesterol:   57.84     Goal: >40   LDL "bad" Cholesterol:   85     Goal: <100   Triglycerides:       170.0     Goal: <150        TLC Diet (Therapeutic Lifestyle Change): Saturated Fats & Transfatty acids should be kept < 7% of total calories ***Reduce Saturated Fats Polyunstaurated Fat can be up to 10% of total calories Monounsaturated Fat Fat can be up to 20% of total calories Total Fat should be no greater than 25-35% of total calories Carbohydrates should be 50-60% of total calories Protein should be approximately 15% of total calories Fiber should be at least 20-30 grams a day ***Increased fiber may help lower LDL Total Cholesterol should be < 200mg /day Consider adding plant stanol/sterols to diet (example: Benacol spread) ***A higher intake of unsaturated fat may reduce Triglycerides and Increase HDL    Adjunctive Measures (may lower LIPIDS and reduce risk of Heart Attack) include: Aerobic Exercise (20-30 minutes 3-4 times a week) Limit Alcohol Consumption Weight Reduction Aspirin 75-81 mg a day by mouth (if not allergic or contraindicated) Dietary Fiber 20-30 grams a day by mouth     Current Medications: 1)    Metformin Hcl 1000 Mg Tabs (Metformin hcl) .... Take 1 tablet by mouth two times a day 2)    Simvastatin 80 Mg Tabs (Simvastatin) .... Take 1 tab by mouth at bedtime 3)    Lantus Solostar 100 Unit/ml Soln (Insulin glargine) .... 33 units at bedtime 4)    Lisinopril 10 Mg Tabs (Lisinopril) .... Take 1 tablet by mouth once a day 5)     Colace  .... At bedtime as needed 6)    Asa 81mg   7)    Vitamin C 1000 Mg Tabs (Ascorbic acid) 8)    Onetouch Test  Strp (Glucose blood) .... Test as directed 9)    Robaxin 500 Mg Tabs (Methocarbamol) 10)    Naproxen 500 Mg Tabs (Naproxen) .... Take 1 tablet by mouth once a day 11)    Dilaudid 2 Mg Tabs (Hydromorphone hcl) .Marland Kitchen.. 1-2 every 4-6 hours prn 12)    Temazepam 30 Mg Caps (Temazepam) .... Take 1 tab by mouth at bedtime 13)    Co-q 100mg    If you have any questions, please call. We appreciate being able to work with you.   Sincerely,    [facility] Jennifer Grandchild MD

## 2010-05-26 NOTE — Letter (Signed)
Summary: Lipid Letter  [facility]  [address] [place], [place] [address]   Phone: [phone]  Fax: [phone]    04/30/2009  Jennifer Baker [address] [place], [place]  [address]  Dear Jennifer Baker:  We have carefully reviewed your last lipid profile from 04/24/2009 and the results are noted below with a summary of recommendations for lipid management.    Cholesterol:       145     Goal: <200   HDL "good" Cholesterol:   63.01     Goal: >40   LDL "bad" Cholesterol:   82     Goal: <100   Triglycerides:       138.0     Goal: <150        TLC Diet (Therapeutic Lifestyle Change): Saturated Fats & Transfatty acids should be kept < 7% of total calories ***Reduce Saturated Fats Polyunstaurated Fat can be up to 10% of total calories Monounsaturated Fat Fat can be up to 20% of total calories Total Fat should be no greater than 25-35% of total calories Carbohydrates should be 50-60% of total calories Protein should be approximately 15% of total calories Fiber should be at least 20-30 grams a day ***Increased fiber may help lower LDL Total Cholesterol should be < 200mg /day Consider adding plant stanol/sterols to diet (example: Benacol spread) ***A higher intake of unsaturated fat may reduce Triglycerides and Increase HDL    Adjunctive Measures (may lower LIPIDS and reduce risk of Heart Attack) include: Aerobic Exercise (20-30 minutes 3-4 times a week) Limit Alcohol Consumption Weight Reduction Aspirin 75-81 mg a day by mouth (if not allergic or contraindicated) Dietary Fiber 20-30 grams a day by mouth     Current Medications: 1)    Metformin Hcl 1000 Mg Tabs (Metformin hcl) .... Take 1 tablet by mouth two times a day 2)    Simvastatin 80 Mg Tabs (Simvastatin) .... Take 1 tab by mouth at bedtime 3)    Lantus 100 Unit/ml Soln (Insulin glargine) .... 33units at bedtime 4)    Lisinopril 10 Mg Tabs (Lisinopril) .... Take 1 tablet by mouth once a day 5)    Colace  ....  At bedtime as needed 6)    Asa 81mg   7)    Vitamin C 1000 Mg Tabs (Ascorbic acid) 8)    Onetouch Test  Strp (Glucose blood) .... Test as directed  If you have any questions, please call. We appreciate being able to work with you.   Sincerely,    [facility] Etta Grandchild MD

## 2010-05-26 NOTE — Consult Note (Signed)
Summary: [facility]  [facility]   Imported By: Lennie Odor 01/01/2010 11:50:28  _____________________________________________________________________  External Attachment:    Type:   Image     Comment:   External Document

## 2010-05-26 NOTE — Progress Notes (Signed)
     Diabetes Management Exam:    Eye Exam:       Eye Exam done elsewhere          Date: 06/16/2009          Results: normal          Done by: Ashley Royalty

## 2010-05-26 NOTE — Letter (Signed)
Summary: [facility]   [facility] for [facility]   Imported By: Sherian Rein 03/27/2010 08:43:44  _____________________________________________________________________  External Attachment:    Type:   Image     Comment:   External Document

## 2010-05-26 NOTE — Letter (Signed)
Summary: Diabetic Instructions  [facility]  [address] [place], [place] [address]   Phone: [phone]  Fax: [phone]    ARUSHI PARTRIDGE 1950-08-20 MRN: [id number]   _  _   ORAL DIABETIC MEDICATION INSTRUCTIONS  The day before your procedure:   Take your diabetic pill as you do normally  The day of your procedure:   Do not take your diabetic pill    We will check your blood sugar levels during the admission process and again in Recovery before discharging you home  ________________________________________________________________________  _  _   INSULIN (LONG ACTING) MEDICATION INSTRUCTIONS (Lantus, NPH, 70/30, Humulin, Novolin-N)   The day before your procedure:   Take  your regular evening dose    The day of your procedure:   Do not take your morning dose    _  _   INSULIN (SHORT ACTING) MEDICATION INSTRUCTIONS (Regular, Humulog, Novolog)   The day before your procedure:   Do not take your evening dose   The day of your procedure:   Do not take your morning dose   _  _   INSULIN PUMP MEDICATION INSTRUCTIONS  We will contact the physician managing your diabetic care for written dosage instructions for the day before your procedure and the day of your procedure.  Once we have received the instructions, we will contact you.

## 2010-05-26 NOTE — Progress Notes (Signed)
  Phone Note Call from Patient   Summary of Call: Pt needs refill of lantus solostar Initial call taken by: Lamar Sprinkles, CMA,  June 09, 2009 3:59 PM    New/Updated Medications: LANTUS SOLOSTAR 100 UNIT/ML SOLN (INSULIN GLARGINE) 33 units at bedtime Prescriptions: LANTUS SOLOSTAR 100 UNIT/ML SOLN (INSULIN GLARGINE) 33 units at bedtime  #3 mth x 1   Entered by:   Lamar Sprinkles, CMA   Authorized by:   Etta Grandchild MD   Signed by:   Lamar Sprinkles, CMA on 06/09/2009   Method used:   Electronically to        [facility]. [phone]* (retail)       [phone]       [place], [place]  [address]       Ph: [phone]       Fax: [phone]   RxID:   [id number]

## 2010-05-28 NOTE — Letter (Signed)
Summary: [facility]   Imported By: Sherian Rein 04/08/2010 14:51:21  _____________________________________________________________________  External Attachment:    Type:   Image     Comment:   External Document

## 2010-05-28 NOTE — Letter (Signed)
Summary: Results Letter  [facility]  [address] [place], [place] [address]   Phone: [phone]  Fax: [phone]        April 03, 2010 MRN: [id number]    Jennifer Baker [address] [place], [place]  [address]    Dear [name],   The polyp removed during your recent procedure was proven to be adenomatous.  These are pre-cancerous polyps that may have grown into cancers if they had not been removed.  Based on current nationally recognized surveillance guidelines, I recommend that you have a repeat colonoscopy in 5 years.  We will therefore put your information in our reminder system and will contact you in 5 years to schedule a repeat procedure.  Please call if you have any questions or concerns.       Sincerely,  Rachael Fee MD  This letter has been electronically signed by your physician.  Appended Document: Results Letter Letter mailed

## 2010-05-28 NOTE — Progress Notes (Signed)
  Phone Note Other Incoming   Request: Send information Summary of Call: Patient completed a [facility] medical release. Patient is requesting records copied and transferred to Dr. Ronald Roberts. Request forwarded to [facility].     

## 2010-06-25 ENCOUNTER — Ambulatory Visit: Payer: Self-pay | Admitting: Internal Medicine

## 2010-07-07 LAB — GLUCOSE, CAPILLARY: Glucose-Capillary: 164 mg/dL — ABNORMAL HIGH (ref 70–99)

## 2010-10-30 ENCOUNTER — Other Ambulatory Visit: Payer: Self-pay | Admitting: Internal Medicine

## 2011-03-16 ENCOUNTER — Other Ambulatory Visit: Payer: Self-pay | Admitting: Internal Medicine

## 2011-03-19 ENCOUNTER — Other Ambulatory Visit: Payer: Self-pay | Admitting: Internal Medicine

## 2011-04-15 ENCOUNTER — Other Ambulatory Visit: Payer: Self-pay | Admitting: Internal Medicine

## 2011-04-15 DIAGNOSIS — Z1231 Encounter for screening mammogram for malignant neoplasm of breast: Secondary | ICD-10-CM

## 2011-04-27 HISTORY — PX: SHOULDER ARTHROSCOPY: SHX128

## 2011-05-18 ENCOUNTER — Other Ambulatory Visit: Payer: Self-pay | Admitting: Internal Medicine

## 2011-05-24 ENCOUNTER — Ambulatory Visit
Admission: RE | Admit: 2011-05-24 | Discharge: 2011-05-24 | Disposition: A | Discharge status: Home / Self Care | Payer: Medicare Other | Source: Ambulatory Visit | Attending: Internal Medicine | Admitting: Internal Medicine

## 2011-05-24 DIAGNOSIS — Z1231 Encounter for screening mammogram for malignant neoplasm of breast: Secondary | ICD-10-CM

## 2011-06-03 ENCOUNTER — Other Ambulatory Visit: Payer: Self-pay | Admitting: Internal Medicine

## 2012-03-08 ENCOUNTER — Other Ambulatory Visit: Payer: Self-pay | Admitting: *Deleted

## 2012-03-29 ENCOUNTER — Other Ambulatory Visit: Payer: Self-pay | Admitting: Internal Medicine

## 2012-03-29 DIAGNOSIS — Z1231 Encounter for screening mammogram for malignant neoplasm of breast: Secondary | ICD-10-CM

## 2012-04-26 HISTORY — PX: ELBOW SURGERY: SHX618

## 2012-05-24 ENCOUNTER — Ambulatory Visit
Admission: RE | Admit: 2012-05-24 | Discharge: 2012-05-24 | Disposition: A | Discharge status: Home / Self Care | Payer: Medicare Other | Source: Ambulatory Visit | Attending: Internal Medicine | Admitting: Internal Medicine

## 2012-05-24 DIAGNOSIS — Z1231 Encounter for screening mammogram for malignant neoplasm of breast: Secondary | ICD-10-CM

## 2012-09-27 ENCOUNTER — Encounter (HOSPITAL_COMMUNITY): Payer: Self-pay | Admitting: *Deleted

## 2012-09-27 ENCOUNTER — Ambulatory Visit (INDEPENDENT_AMBULATORY_CARE_PROVIDER_SITE_OTHER): Payer: Medicare Other | Admitting: Psychiatry

## 2012-09-27 DIAGNOSIS — F4321 Adjustment disorder with depressed mood: Secondary | ICD-10-CM

## 2012-09-27 NOTE — Progress Notes (Signed)
Psychiatric Assessment Adult  Patient Identification:  Jennifer Baker Date of Evaluation:  09/27/2012 Chief Complaint: attention deficit disorder of evaluation  History of Chief Complaint:  No chief complaint on file. 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 Sharlette Dense for an attention deficit disorder evaluation. According to Dr. Wyn Quaker the testing that was done on this patient was equivocal about determining if she had ADD. I interpret that this doctor sending her for a second opinion and evaluation for medications. The patient is presently in her third marriage of 6 years. She is a good relationship with her husband. The patient has no children. The patient presently works with her husband at [employer]. 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 The patient has a very active important job that takes a great deal of energy and concentration. 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 She lost her job due to her personality conflict but not because her problem with performance. This patient denies depression. She is sleeping and eating well. She has good energy. The patient denies anhedonia. She enjoys reading watching TV and playing with her dog. The patient denies any problems concentrating. She denies worthlessness. She is not suicidal now and never has been. This patient denies the use of alcohol or drugs. She denies psychotic symptomatology. She denies ever having an episode of major depression. She denies irritability the patient denies ever having an episode of mania. She denies any specific anxiety symptoms consistent with generalized anxiety disorder, panic disorder or OCD. In general she describes no significant anxiety. The patient grew up in [place]. By the fourth grade she moved to [place] a [place] years with her family. But in her second and  third grade classes that she remembers she had no problems off school. She remembers being a B. Student. He never had conduct problems remembers finishing her homework and to her projects. The patient high school did fairly well. This patient's primary care doctor this year start her on Zoloft per the request from her psychologist. In my view I find no indication to take Zoloft. Her irritability and her systolic needing to be in control I think is a personality feature and has been present almost all of her adult life. There is no evidence of psychopathology in this patient. I do not believe that she has any attention disorder.  HPI Review of Systems Physical Exam  Depressive Symptoms: difficulty concentrating,  (Hypo) Manic Symptoms:   Elevated Mood:  No Irritable Mood:  No Grandiosity:  No Distractibility:  No Labiality of Mood:  No Delusions:  No Hallucinations:  No Impulsivity:  No Sexually Inappropriate Behavior:  No Financial Extravagance:  No Flight of Ideas:  No  Anxiety Symptoms: Excessive Worry:  No Panic Symptoms:  No Agoraphobia:  No Obsessive Compulsive: No  Symptoms: None, Specific Phobias:  No Social Anxiety:  No  Psychotic Symptoms:  Hallucinations: No None Delusions:  No Paranoia:  No   Ideas of Reference:  No  PTSD Symptoms: Ever had a traumatic exposure:  No Had a traumatic exposure in the last month:  No Re-experiencing: No None Hypervigilance:  No Hyperarousal: No None Avoidance: No None  Traumatic Brain Injury: No   Past Psychiatric History: History of Loss of Consciousness:  No Seizure History:  No Cardiac History:  No Allergies:  Allergies  not on file Current Medications:  Current Outpatient Prescriptions  Medication Sig Dispense Refill  . LANTUS SOLOSTAR 100 UNIT/ML injection inject 33 units at bedtime  10 mL  0  . simvastatin (ZOCOR) 80 MG tablet TAKE 1 TABLET BY MOUTH AT BEDTIME  30 tablet  4   No current facility-administered  medications for this visit.    Previous Psychotropic Medications:  Medication Dose   zoloft 50                       Substance Abuse History in the last 12 months:   Medical Consequences of Substance Abuse:   Legal Consequences of Substance Abuse:   Family Consequences of Substance Abuse:   Blackouts:   DT's:   Withdrawal Symptoms:     Social History: Current Place of Residence:  Place of Birth:  Family Members:  Marital Status:  Married Children:   Sons:   Daughters:  Relationships:  Education:  Corporate treasurer Problems/Performance:  Religious Beliefs/Practices:  History of Abuse:  Teacher, music History:   Legal History:  Hobbies/Interests:   Family History:  No family history on file.  Mental Status Examination/Evaluation: Objective:  Appearance: Meticulous  Eye Contact::  Good  Speech:  Clear and Coherent  Volume:  Normal  Mood:  normal  Affect:  Congruent  Thought Process:  Coherent  Orientation:  Full (Time, Place, and Person)  Thought Content:  WDL  Suicidal Thoughts:  No  Homicidal Thoughts:  No  Judgement:  Good  Insight:  Good  Psychomotor Activity:  Normal  Akathisia:  No  Handed:  Right  AIMS (if indicated):    Assets:  Desire for Improvement    Laboratory/X-Ray Psychological Evaluation(s)        Assessment:  Adjustment disorder with depression  AXIS I Adjustment Disorder with Depressed Mood  AXIS II Deferred  AXIS III No past medical history on file.   AXIS IV problems related to social environment  AXIS V 61-70 mild symptoms   Treatment Plan/Recommendations:  Plan of Care: at this time I find no evidence of psychopathology. This patient has no mood disorders or psychotic symptomatology. I find no clear evidence of an attention disorder.  Laboratory:    Psychotherapy: Dr. Sharlette Dense  Medications:   Routine PRN Medications:    Consultations:   Safety Concerns:    Other:      Lucas Mallow, MD 6/4/20141:58 PM

## 2012-11-22 ENCOUNTER — Ambulatory Visit (INDEPENDENT_AMBULATORY_CARE_PROVIDER_SITE_OTHER): Payer: Medicare Other | Admitting: Podiatry

## 2012-11-22 ENCOUNTER — Other Ambulatory Visit: Payer: Self-pay | Admitting: *Deleted

## 2012-11-22 ENCOUNTER — Encounter: Payer: Self-pay | Admitting: Podiatry

## 2012-11-22 VITALS — BP 113/60 | HR 70 | Ht 63.0 in | Wt 147.0 lb

## 2012-11-22 DIAGNOSIS — M795 Residual foreign body in soft tissue: Secondary | ICD-10-CM

## 2012-11-22 DIAGNOSIS — M25572 Pain in left ankle and joints of left foot: Secondary | ICD-10-CM

## 2012-11-22 DIAGNOSIS — M25579 Pain in unspecified ankle and joints of unspecified foot: Secondary | ICD-10-CM

## 2012-11-22 MED ORDER — METFORMIN HCL ER 500 MG PO TB24: 500.0000 mg | ORAL_TABLET | Freq: Every day | ORAL | Status: DC

## 2012-11-22 NOTE — Progress Notes (Signed)
Subjective: Patient came in stating that she may have a splinter on left foot. It hurts to touch.  She had STJ implant on left that helped relieving pain on left foot.  Patient has no other complaints. She wears high heels only for a short period.   Objective: Small splinter removed from left lateral plantar surface with chisel blade. Pain was relieved. No associated edema or erythema noted.  Assessment: Foreign body superficial limited to subdermal layer.  Plan: Splinter removed (0.3cm long dark matter). Area cleansed with Betadine solution. Keep it covered with band aid till pain subside.

## 2012-11-23 ENCOUNTER — Other Ambulatory Visit: Payer: Self-pay | Admitting: *Deleted

## 2012-11-23 MED ORDER — METFORMIN HCL ER 500 MG PO TB24: ORAL_TABLET | ORAL | Status: DC

## 2012-12-13 ENCOUNTER — Other Ambulatory Visit: Payer: Self-pay | Admitting: *Deleted

## 2012-12-13 MED ORDER — ONETOUCH DELICA LANCETS FINE MISC: 1.0000 | Freq: Two times a day (BID) | Status: DC

## 2012-12-18 ENCOUNTER — Other Ambulatory Visit: Payer: Self-pay | Admitting: *Deleted

## 2012-12-18 DIAGNOSIS — E039 Hypothyroidism, unspecified: Secondary | ICD-10-CM

## 2012-12-18 DIAGNOSIS — E119 Type 2 diabetes mellitus without complications: Secondary | ICD-10-CM

## 2012-12-18 DIAGNOSIS — E785 Hyperlipidemia, unspecified: Secondary | ICD-10-CM

## 2012-12-20 ENCOUNTER — Other Ambulatory Visit (INDEPENDENT_AMBULATORY_CARE_PROVIDER_SITE_OTHER): Payer: Medicare Other

## 2012-12-20 DIAGNOSIS — E039 Hypothyroidism, unspecified: Secondary | ICD-10-CM

## 2012-12-20 DIAGNOSIS — E119 Type 2 diabetes mellitus without complications: Secondary | ICD-10-CM

## 2012-12-20 DIAGNOSIS — E785 Hyperlipidemia, unspecified: Secondary | ICD-10-CM

## 2012-12-20 LAB — URINALYSIS
Bilirubin Urine: NEGATIVE
Hgb urine dipstick: NEGATIVE
Ketones, ur: NEGATIVE
Leukocytes, UA: NEGATIVE
Nitrite: NEGATIVE
Specific Gravity, Urine: 1.015
Total Protein, Urine: NEGATIVE
Urine Glucose: NEGATIVE
Urobilinogen, UA: 0.2
pH: 7 (ref 5.0–8.0)

## 2012-12-20 LAB — HEMOGLOBIN A1C: Hgb A1c MFr Bld: 6.8 % — ABNORMAL HIGH (ref 4.6–6.5)

## 2012-12-21 LAB — LIPID PANEL
Cholesterol: 135 mg/dL (ref 0–200)
HDL: 41.5 mg/dL (ref >39.00)
LDL Cholesterol: 69 mg/dL (ref 0–99)
Triglycerides: 125 mg/dL (ref 0.0–149.0)
VLDL: 25 mg/dL (ref 0.0–40.0)

## 2012-12-21 LAB — COMPREHENSIVE METABOLIC PANEL
ALT: 20 U/L (ref 0–35)
CO2: 27 mEq/L (ref 19–32)
Calcium: 9.5 mg/dL (ref 8.4–10.5)
Chloride: 105 mEq/L (ref 96–112)
Creatinine, Ser: 0.6 mg/dL (ref 0.4–1.2)
GFR: 109.46 mL/min (ref >60.00)
Glucose, Bld: 86 mg/dL (ref 70–99)
Sodium: 140 mEq/L (ref 135–145)
Total Protein: 7 g/dL (ref 6.0–8.3)

## 2012-12-26 ENCOUNTER — Ambulatory Visit (INDEPENDENT_AMBULATORY_CARE_PROVIDER_SITE_OTHER): Payer: Medicare Other | Admitting: Endocrinology

## 2012-12-26 ENCOUNTER — Encounter: Payer: Self-pay | Admitting: Endocrinology

## 2012-12-26 ENCOUNTER — Other Ambulatory Visit: Payer: Self-pay | Admitting: *Deleted

## 2012-12-26 VITALS — BP 116/54 | HR 68 | Temp 97.9°F | Resp 12

## 2012-12-26 DIAGNOSIS — E119 Type 2 diabetes mellitus without complications: Secondary | ICD-10-CM

## 2012-12-26 DIAGNOSIS — E063 Autoimmune thyroiditis: Secondary | ICD-10-CM | POA: Insufficient documentation

## 2012-12-26 DIAGNOSIS — E039 Hypothyroidism, unspecified: Secondary | ICD-10-CM

## 2012-12-26 DIAGNOSIS — E785 Hyperlipidemia, unspecified: Secondary | ICD-10-CM

## 2012-12-26 MED ORDER — GLUCOSE BLOOD VI STRP: ORAL_STRIP | Status: DC

## 2012-12-26 NOTE — Progress Notes (Signed)
Patient ID: Jennifer Baker, female   DOB: [birth year], 62 y.o.   MRN: [id number]  Jennifer Baker is an 62 y.o. female.   Reason for Appointment: Diabetes follow-up   History of Present Illness   Diagnosis: Type 2 DIABETES MELITUS, date of diagnosis:    She has been on basal insulin after a trial of metformin and Byetta and has been on Lantus for several years She did do better with adding Victoza in 05/2010 with weight loss, reduced insulin dose and better control She has generally well controlled blood sugars but his usually not checking postprandial readings despite reminders Her A1c appears to be higher and she thinks this is from inadequate diet and exercise. Has not checked many readings nonfasting but did have one high reading of 223       Oral hypoglycemic drugs: Metformin ER 1500 mg        Side effects from medications: None Insulin regimen: 25 units           Proper timing of medications in relation to meals: Yes.         Monitors blood glucose: Once a day.    Glucometer: One Touch.          Blood Glucose readings from meter download: readings before breakfast: 79-89, evening 100, 223  Hypoglycemia frequency:  none recently.          Meals: 3 meals per day. Eating more ice cream, small portions of rice          Physical activity: exercise: to start at Gym; walking 1 mile, 1-2/7           Dietician visit: Most recent: 2012, also with husband in 2014         Complications: are: None    The last HbgA1c was reported as  6.6 % in 6/14    Wt Readings from Last 3 Encounters:  11/22/12 147 lb (66.679 kg)  03/25/10 151 lb (68.493 kg)  01/06/10 150 lb (68.04 kg)    Appointment on 12/20/2012  Component Date Value Range Status  . Hemoglobin A1C 12/20/2012 6.8* 4.6 - 6.5 % Final   Glycemic Control Guidelines for People with Diabetes:Non Diabetic:  <6%Goal of Therapy: <7%Additional Action Suggested:  >8%   . Sodium 12/20/2012 140  135 - 145 mEq/L Final  . Potassium 12/20/2012 4.4  3.5 -  5.1 mEq/L Final  . Chloride 12/20/2012 105  96 - 112 mEq/L Final  . CO2 12/20/2012 27  19 - 32 mEq/L Final  . Glucose, Bld 12/20/2012 86  70 - 99 mg/dL Final  . BUN 40/98/1191 11  6 - 23 mg/dL Final  . Creatinine, Ser 12/20/2012 0.6  0.4 - 1.2 mg/dL Final  . Total Bilirubin 12/20/2012 0.7  0.3 - 1.2 mg/dL Final  . Alkaline Phosphatase 12/20/2012 50  39 - 117 U/L Final  . AST 12/20/2012 14  0 - 37 U/L Final  . ALT 12/20/2012 20  0 - 35 U/L Final  . Total Protein 12/20/2012 7.0  6.0 - 8.3 g/dL Final  . Albumin 47/82/9562 4.3  3.5 - 5.2 g/dL Final  . Calcium 13/11/6576 9.5  8.4 - 10.5 mg/dL Final  . GFR 46/96/2952 109.46  >60.00 mL/min Final  . Color, Urine 12/20/2012 LT. YELLOW  Yellow;Lt. Yellow Final  . APPearance 12/20/2012 CLEAR  Clear Final  . Specific Gravity, Urine 12/20/2012 1.015  1.000-1.030 Final  . pH 12/20/2012 7.0  5.0 - 8.0 Final  . Total  Protein, Urine 12/20/2012 NEGATIVE  Negative Final  . Urine Glucose 12/20/2012 NEGATIVE  Negative Final  . Ketones, ur 12/20/2012 NEGATIVE  Negative Final  . Bilirubin Urine 12/20/2012 NEGATIVE  Negative Final  . Hgb urine dipstick 12/20/2012 NEGATIVE  Negative Final  . Urobilinogen, UA 12/20/2012 0.2  0.0 - 1.0 Final  . Leukocytes, UA 12/20/2012 NEGATIVE  Negative Final  . Nitrite 12/20/2012 NEGATIVE  Negative Final  . Microalb, Ur 12/20/2012 0.4  0.0 - 1.9 mg/dL Final  . Creatinine,U 16/01/9603 94.9   Final  . Microalb Creat Ratio 12/20/2012 0.4  0.0 - 30.0 mg/g Final  . Free T4 12/20/2012 0.79  0.60 - 1.60 ng/dL Final  . TSH 54/12/8117 2.04  0.35 - 5.50 uIU/mL Final  . Cholesterol 12/20/2012 135  0 - 200 mg/dL Final   ATP III Classification       Desirable:  < 200 mg/dL               Borderline High:  200 - 239 mg/dL          High:  > = 147 mg/dL  . Triglycerides 12/20/2012 125.0  0.0 - 149.0 mg/dL Final   Normal:  <829 mg/dLBorderline High:  150 - 199 mg/dL  . HDL 12/20/2012 41.50  >39.00 mg/dL Final  . VLDL 56/21/3086 25.0  0.0  - 40.0 mg/dL Final  . LDL Cholesterol 12/20/2012 69  0 - 99 mg/dL Final  . Total CHOL/HDL Ratio 12/20/2012 3   Final                  Men          Women1/2 Average Risk     3.4          3.3Average Risk          5.0          4.42X Average Risk          9.6          7.13X Average Risk          15.0          11.0                          Medication List       This list is accurate as of: 12/26/12 11:59 PM.  Always use your most recent med list.               aspirin 81 MG tablet  Take 81 mg by mouth daily.     glucose blood test strip  Commonly known as:  ONE TOUCH ULTRA TEST  Use to check blood sugars three times per day     LANTUS SOLOSTAR 100 UNIT/ML Sopn  Generic drug:  Insulin Glargine     levothyroxine 50 MCG tablet  Commonly known as:  SYNTHROID, LEVOTHROID  Take 50 mcg by mouth daily before breakfast.     metFORMIN 500 MG 24 hr tablet  Commonly known as:  GLUCOPHAGE-XR  Take 3 tablets every morning     NOVOTWIST 32G X 5 MM Misc  Generic drug:  Insulin Pen Needle     ONETOUCH DELICA LANCETS FINE Misc  1 each by Does not apply route 2 (two) times daily.     RESTASIS 0.05 % ophthalmic emulsion  Generic drug:  cycloSPORINE  Place 1 drop into both eyes every 12 (twelve) hours.     sertraline 50 MG  tablet  Commonly known as:  ZOLOFT  Take 50 mg by mouth daily.     simvastatin 80 MG tablet  Commonly known as:  ZOCOR  TAKE 1 TABLET BY MOUTH AT BEDTIME     VICTOZA 18 MG/3ML Sopn  Generic drug:  Liraglutide  1.2 mg.        Allergies: No Known Allergies  No past medical history on file.  Past Surgical History  Procedure Laterality Date  . Abdominal hysterectomy      Family History  Problem Relation Age of Onset  . Diabetes Mother   . Heart disease Brother   . Early death Brother     Social History:  reports that she has never smoked. She has never used smokeless tobacco. Her alcohol and drug histories are not on file.  Review of Systems:  She was  previously on low dose medication for hypertension but this was stopped because of lower blood pressure  HYPERLIPIDEMIA: The lipid abnormality consists of elevated LDL, on simvastatin 3 years with good control, LDL 69  History of mild hypothyroidism, taking levothyroxine 50 mcg     Examination:   BP 116/54  Pulse 68  Temp(Src) 97.9 F (36.6 C)  Resp 12  SpO2 98%  There is no weight on file to calculate BMI.   ASSESSMENT/ PLAN::   Diabetes type 2   The patient's diabetes control appears to be slightly worse because of inconsistent diet and exercise Most likely has high postprandial readings since fasting readings are excellent She will start monitoring more readings after meals, improve her diet with reduced sweets and carbohydrates  Encouraged her to exercise regularly, she is going to join a gym now  Hypothyroidism: Adequately replaced  Lipids are excellent with simvastatin and since she is concerned about side effects with 80 mg can try 40 mg  Daylen Hack 12/28/2012, 1:53 PM

## 2012-12-26 NOTE — Patient Instructions (Addendum)
May reduce Simvastatin to 40 mg  Please check blood sugars at least half the time about 2 hours after any meal and as directed on waking up. Please bring blood sugar monitor to each visit  Exercise 5 days a week

## 2012-12-28 ENCOUNTER — Encounter: Payer: Self-pay | Admitting: Endocrinology

## 2013-01-10 ENCOUNTER — Telehealth: Payer: Self-pay | Admitting: Endocrinology

## 2013-01-10 MED ORDER — LIRAGLUTIDE 18 MG/3ML ~~LOC~~ SOPN: 1.2000 mg | PEN_INJECTOR | Freq: Every day | SUBCUTANEOUS | Status: DC

## 2013-01-10 NOTE — Telephone Encounter (Signed)
rx sent

## 2013-01-16 ENCOUNTER — Telehealth: Payer: Self-pay | Admitting: Endocrinology

## 2013-01-16 MED ORDER — INSULIN GLARGINE 100 UNIT/ML SOLOSTAR PEN: 20.0000 [IU] | PEN_INJECTOR | Freq: Every day | SUBCUTANEOUS | Status: DC

## 2013-01-16 NOTE — Telephone Encounter (Signed)
Rx sent to [facility]

## 2013-02-16 ENCOUNTER — Other Ambulatory Visit: Payer: Self-pay | Admitting: *Deleted

## 2013-02-16 MED ORDER — INSULIN PEN NEEDLE 32G X 5 MM MISC: Status: DC

## 2013-03-16 ENCOUNTER — Other Ambulatory Visit (INDEPENDENT_AMBULATORY_CARE_PROVIDER_SITE_OTHER): Payer: Medicare Other

## 2013-03-16 DIAGNOSIS — E119 Type 2 diabetes mellitus without complications: Secondary | ICD-10-CM

## 2013-03-16 LAB — BASIC METABOLIC PANEL
BUN: 12 mg/dL (ref 6–23)
CO2: 28 mEq/L (ref 19–32)
Calcium: 9.4 mg/dL (ref 8.4–10.5)
Chloride: 105 mEq/L (ref 96–112)
Creatinine, Ser: 0.6 mg/dL (ref 0.4–1.2)

## 2013-03-23 ENCOUNTER — Other Ambulatory Visit: Payer: Self-pay

## 2013-03-27 ENCOUNTER — Other Ambulatory Visit: Payer: Self-pay | Admitting: *Deleted

## 2013-03-27 ENCOUNTER — Ambulatory Visit (INDEPENDENT_AMBULATORY_CARE_PROVIDER_SITE_OTHER): Payer: Medicare Other | Admitting: Endocrinology

## 2013-03-27 ENCOUNTER — Encounter: Payer: Self-pay | Admitting: Endocrinology

## 2013-03-27 VITALS — BP 116/78 | HR 72 | Temp 98.3°F | Resp 12 | Ht 59.0 in | Wt 145.5 lb

## 2013-03-27 DIAGNOSIS — E039 Hypothyroidism, unspecified: Secondary | ICD-10-CM

## 2013-03-27 DIAGNOSIS — E785 Hyperlipidemia, unspecified: Secondary | ICD-10-CM

## 2013-03-27 MED ORDER — DULAGLUTIDE 1.5 MG/0.5ML ~~LOC~~ SOAJ: SUBCUTANEOUS | Status: DC

## 2013-03-27 MED ORDER — SIMVASTATIN 40 MG PO TABS: 40.0000 mg | ORAL_TABLET | Freq: Every day | ORAL | Status: DC

## 2013-03-27 NOTE — Patient Instructions (Signed)
Please check blood sugars at least half the time about 2 hours after any meal and as directed on waking up. Please bring blood sugar monitor to each visit  Start exercise daily  Check on Trulicity 1.5mg  weekly

## 2013-03-27 NOTE — Progress Notes (Addendum)
Patient ID: Jennifer Baker, female   DOB: 1950-08-07, 62 y.o.   MRN: [id number]  Jennifer Baker is an 62 y.o. female.   Reason for Appointment: Diabetes follow-up   History of Present Illness   Diagnosis: Type 2 DIABETES MELITUS, date of diagnosis:1997    She has been on basal insulin after a trial of metformin and Byetta and has been on Lantus for several years She did do better with adding Victoza in 05/2010 with weight loss, reduced insulin dose and better control On occasion she may forget her insulin and Victoza although her husband usually reminds her She has generally well controlled blood sugars but checking very infrequently Her A1c appears to be slightly better now but has not had any exercise. Also had steroid injections in her elbow joint a couple months ago     Oral hypoglycemic drugs: Metformin ER 1500 mg        Side effects from medications: None Insulin regimen: Lantus 25 units hs          Proper timing of medications in relation to meals: Yes.         Monitors blood glucose: Once a day.    Glucometer: One Touch.          Blood Glucose readings from meter download:  PREMEAL Breakfast Lunch Dinner Bedtime Overall  Glucose range:  93, 101   83, 96  ?   87    Mean/median:      93     Hypoglycemia frequency:  none recently.          Meals: 3 meals per day. Watching her sweets somewhat better         Physical activity: exercise: to start at [facility], still not motivated. Also had elbow surgery           Dietician visit: Most recent: 2012, also with husband in 2014         Complications: are: None      Wt Readings from Last 3 Encounters:  03/27/13 145 lb 8 oz (65.998 kg)  11/22/12 147 lb (66.679 kg)  03/25/10 151 lb (68.493 kg)   LABS:  Lab Results  Component Value Date   HGBA1C 6.7* 03/16/2013   HGBA1C 6.8* 12/20/2012   HGBA1C 8.0* 03/25/2010   Lab Results  Component Value Date   MICROALBUR 0.4 12/20/2012   LDLCALC 69 12/20/2012   CREATININE 0.6 03/16/2013       Medication List       This list is accurate as of: 03/27/13 11:23 AM.  Always use your most recent med list.               aspirin 81 MG tablet  Take 81 mg by mouth daily.     glucose blood test strip  Commonly known as:  ONE TOUCH ULTRA TEST  Use to check blood sugars three times per day     Insulin Glargine 100 UNIT/ML Sopn  25 Units by Other route daily.     Insulin Pen Needle 32G X 5 MM Misc  Commonly known as:  NOVOTWIST  Use 2 times daily. Dx code: 250.00     levothyroxine 50 MCG tablet  Commonly known as:  SYNTHROID, LEVOTHROID  Take 50 mcg by mouth daily before breakfast.     Liraglutide 18 MG/3ML Sopn  Commonly known as:  VICTOZA  Inject 1.2 mg into the skin daily.     metFORMIN 500 MG 24 hr tablet  Commonly known  as:  GLUCOPHAGE-XR  Take 3 tablets every morning     ONETOUCH DELICA LANCETS FINE Misc  1 each by Does not apply route 2 (two) times daily.     RESTASIS 0.05 % ophthalmic emulsion  Generic drug:  cycloSPORINE  Place 1 drop into both eyes every 12 (twelve) hours.     sertraline 50 MG tablet  Commonly known as:  ZOLOFT  Take 50 mg by mouth daily.     simvastatin 80 MG tablet  Commonly known as:  ZOCOR  TAKE 1 TABLET BY MOUTH AT BEDTIME     vitamin B-12 1000 MCG tablet  Commonly known as:  CYANOCOBALAMIN  Take 1,000 mcg by mouth daily.        Allergies: No Known Allergies  No past medical history on file.  Past Surgical History  Procedure Laterality Date  . Abdominal hysterectomy      Family History  Problem Relation Age of Onset  . Diabetes Mother   . Heart disease Brother   . Early death Brother     Social History:  reports that she has never smoked. She has never used smokeless tobacco. Her alcohol and drug histories are not on file.  Review of Systems:  She was previously on low dose medication for hypertension but this was stopped because of lower blood pressure  HYPERLIPIDEMIA: The lipid abnormality consists of  elevated LDL, on simvastatin 80 mg for 3 years with good control,  did not reduce the dose to half tablet  History of mild hypothyroidism, taking levothyroxine 50 mcg, last TSH normal     Examination:   BP 116/78  Pulse 72  Temp(Src) 98.3 F (36.8 C)  Resp 12  Ht 4\' 11"  (1.499 m)  Wt 145 lb 8 oz (65.998 kg)  BMI 29.37 kg/m2  SpO2 96%  Body mass index is 29.37 kg/(m^2).   ASSESSMENT/ PLAN::   Diabetes type 2   The patient's diabetes control appears to be fair. Although A1c is higher than expected her home readings are quite normal May have had some high readings with steroid injections a couple of months ago She is not checking readings much She will start monitoring more readings after meals, improve her diet with reduced sweets and carbohydrates  Encouraged her to exercise regularly, she is going to join a gym now  She is interested in trying the Trulicity injection for the weekly convenience. Demonstrated to her how this is done and she will switch if her insurance covers this  LDL is low normal with 80 mg simvastatin and since she is concerned about side effects with 80 mg can send you prescription for 40 mg  Jaevin Medearis 03/27/2013, 11:23 AM

## 2013-03-28 NOTE — Addendum Note (Signed)
Addended by: Reather Littler on: 03/28/2013 11:16 AM   Modules accepted: Level of Service

## 2013-05-07 ENCOUNTER — Other Ambulatory Visit: Payer: Self-pay

## 2013-05-07 DIAGNOSIS — Z1231 Encounter for screening mammogram for malignant neoplasm of breast: Secondary | ICD-10-CM

## 2013-05-25 ENCOUNTER — Ambulatory Visit
Admission: RE | Admit: 2013-05-25 | Discharge: 2013-05-25 | Disposition: A | Discharge status: Home / Self Care | Payer: Medicare Other | Source: Ambulatory Visit

## 2013-05-25 DIAGNOSIS — Z1231 Encounter for screening mammogram for malignant neoplasm of breast: Secondary | ICD-10-CM

## 2013-05-30 ENCOUNTER — Other Ambulatory Visit: Payer: Self-pay | Admitting: Endocrinology

## 2013-06-15 ENCOUNTER — Other Ambulatory Visit: Payer: Self-pay | Admitting: *Deleted

## 2013-06-15 MED ORDER — DULAGLUTIDE 1.5 MG/0.5ML ~~LOC~~ SOAJ: SUBCUTANEOUS | Status: DC

## 2013-06-18 ENCOUNTER — Other Ambulatory Visit: Payer: Self-pay | Admitting: *Deleted

## 2013-06-18 MED ORDER — DULAGLUTIDE 1.5 MG/0.5ML ~~LOC~~ SOAJ: SUBCUTANEOUS | Status: DC

## 2013-06-25 ENCOUNTER — Other Ambulatory Visit: Payer: Self-pay | Admitting: *Deleted

## 2013-06-25 MED ORDER — DULAGLUTIDE 1.5 MG/0.5ML ~~LOC~~ SOAJ: SUBCUTANEOUS | Status: DC

## 2013-07-20 ENCOUNTER — Other Ambulatory Visit (INDEPENDENT_AMBULATORY_CARE_PROVIDER_SITE_OTHER): Payer: Medicare Other

## 2013-07-20 DIAGNOSIS — E1165 Type 2 diabetes mellitus with hyperglycemia: Principal | ICD-10-CM

## 2013-07-20 DIAGNOSIS — E039 Hypothyroidism, unspecified: Secondary | ICD-10-CM

## 2013-07-20 DIAGNOSIS — E785 Hyperlipidemia, unspecified: Secondary | ICD-10-CM

## 2013-07-20 DIAGNOSIS — IMO0001 Reserved for inherently not codable concepts without codable children: Secondary | ICD-10-CM

## 2013-07-20 LAB — COMPREHENSIVE METABOLIC PANEL
ALBUMIN: 4.5 g/dL (ref 3.5–5.2)
ALK PHOS: 50 U/L (ref 39–117)
ALT: 21 U/L (ref 0–35)
AST: 20 U/L (ref 0–37)
BUN: 16 mg/dL (ref 6–23)
CO2: 21 mEq/L (ref 19–32)
Calcium: 9.4 mg/dL (ref 8.4–10.5)
Chloride: 106 mEq/L (ref 96–112)
Creatinine, Ser: 0.6 mg/dL (ref 0.4–1.2)
GFR: 107.15 mL/min (ref >60.00)
Glucose, Bld: 102 mg/dL — ABNORMAL HIGH (ref 70–99)
POTASSIUM: 3.9 meq/L (ref 3.5–5.1)
Sodium: 138 mEq/L (ref 135–145)
TOTAL PROTEIN: 7 g/dL (ref 6.0–8.3)
Total Bilirubin: 0.4 mg/dL (ref 0.3–1.2)

## 2013-07-20 LAB — LIPID PANEL
CHOL/HDL RATIO: 3
Cholesterol: 125 mg/dL (ref 0–200)
HDL: 40.3 mg/dL (ref >39.00)
LDL CALC: 67 mg/dL (ref 0–99)
TRIGLYCERIDES: 88 mg/dL (ref 0.0–149.0)
VLDL: 17.6 mg/dL (ref 0.0–40.0)

## 2013-07-20 LAB — T4, FREE: FREE T4: 0.64 ng/dL (ref 0.60–1.60)

## 2013-07-20 LAB — HEMOGLOBIN A1C: HEMOGLOBIN A1C: 6.1 % (ref 4.6–6.5)

## 2013-07-20 LAB — TSH: TSH: 1.13 u[IU]/mL (ref 0.35–5.50)

## 2013-07-26 ENCOUNTER — Encounter: Payer: Self-pay | Admitting: Endocrinology

## 2013-07-26 ENCOUNTER — Ambulatory Visit (INDEPENDENT_AMBULATORY_CARE_PROVIDER_SITE_OTHER): Payer: Medicare Other | Admitting: Endocrinology

## 2013-07-26 VITALS — BP 122/62 | HR 69 | Temp 97.7°F | Resp 16 | Ht 59.0 in | Wt 135.8 lb

## 2013-07-26 DIAGNOSIS — E1165 Type 2 diabetes mellitus with hyperglycemia: Principal | ICD-10-CM

## 2013-07-26 DIAGNOSIS — IMO0001 Reserved for inherently not codable concepts without codable children: Secondary | ICD-10-CM

## 2013-07-26 DIAGNOSIS — E785 Hyperlipidemia, unspecified: Secondary | ICD-10-CM

## 2013-07-26 DIAGNOSIS — E039 Hypothyroidism, unspecified: Secondary | ICD-10-CM

## 2013-07-26 NOTE — Progress Notes (Signed)
Patient ID: Jennifer Baker, female   DOB: Dec 08, 1950, 63 y.o.   MRN: [id number]   Reason for Appointment: Diabetes follow-up   History of Present Illness   Diagnosis: Type 2 DIABETES MELITUS, date of diagnosis:1997    She has been on basal insulin after a trial of metformin and Byetta and has been on Lantus for several years She had done better with adding Victoza in 05/2010 with weight loss, reduced insulin dose and better control Because of occasional noncompliance with Victoza she was switched to Trulicity in 08/65 With this she has had no side effects of nausea and is able to take it every Sunday She thinks she has cut back on her portions with starting this and has lost 10 pounds Also her insulin dose has been reduced by 7 units She has been irregular with her exercise because of her husband's illness However she was previously going to the gym  Her blood sugars appear to be improved further and A1c is down to 6.1; morning readings are low normal Again has not done any readings after meals as directed     Oral hypoglycemic drugs: Metformin ER 1500 mg        Side effects from medications: None Insulin regimen: Lantus 18 units hs          Proper timing of medications in relation to meals: Yes.         Monitors blood glucose: Once a day.    Glucometer: One Touch.          Blood Glucose readings from meter download show fasting readings ranging from 75-90, median 80  Hypoglycemia frequency:  none recently.           Meals: 3 meals per day. Watching her portions well       Physical activity: exercise: less now        Dietician visit: Most recent: 2012, also with husband in [id number]         Complications: are: None      Wt Readings from Last 3 Encounters:  07/26/13 135 lb 12.8 oz (61.598 kg)  03/27/13 145 lb 8 oz (65.998 kg)  11/22/12 147 lb (66.679 kg)   LABS:  Lab Results  Component Value Date   HGBA1C 6.1 07/20/2013   HGBA1C 6.7* 03/16/2013   HGBA1C 6.8* 12/20/2012   Lab  Results  Component Value Date   MICROALBUR 0.4 12/20/2012   LDLCALC 67 07/20/2013   CREATININE 0.6 07/20/2013      Medication List       This list is accurate as of: 07/26/13 10:55 AM.  Always use your most recent med list.               aspirin 81 MG tablet  Take 81 mg by mouth daily.     Dulaglutide 1.5 MG/0.5ML Sopn  Commonly known as:  TRULICITY  Inject once a week     glucose blood test strip  Commonly known as:  ONE TOUCH ULTRA TEST  Use to check blood sugars three times per day     Insulin Glargine 100 UNIT/ML Solostar Pen  Commonly known as:  LANTUS  25 Units by Other route daily.     LANTUS SOLOSTAR 100 UNIT/ML Solostar Pen  Generic drug:  Insulin Glargine  INJECT 18 UNITS SUBCUTANEOUSLY DAILY     Insulin Pen Needle 32G X 5 MM Misc  Commonly known as:  NOVOTWIST  Use 2 times daily. Dx code: 250.00  levothyroxine 50 MCG tablet  Commonly known as:  SYNTHROID, LEVOTHROID  Take 50 mcg by mouth daily before breakfast.     Liraglutide 18 MG/3ML Sopn  Commonly known as:  VICTOZA  Inject 1.2 mg into the skin daily.     metFORMIN 500 MG 24 hr tablet  Commonly known as:  GLUCOPHAGE-XR  Take 3 tablets every morning     ONETOUCH DELICA LANCETS FINE Misc  1 each by Does not apply route 2 (two) times daily.     RESTASIS 0.05 % ophthalmic emulsion  Generic drug:  cycloSPORINE  Place 1 drop into both eyes every 12 (twelve) hours.     sertraline 50 MG tablet  Commonly known as:  ZOLOFT  Take 50 mg by mouth daily.     simvastatin 40 MG tablet  Commonly known as:  ZOCOR  Take 1 tablet (40 mg total) by mouth daily at 6 PM.     vitamin B-12 1000 MCG tablet  Commonly known as:  CYANOCOBALAMIN  Take 1,000 mcg by mouth daily.        Allergies: No Known Allergies  No past medical history on file.  Past Surgical History  Procedure Laterality Date  . Abdominal hysterectomy      Family History  Problem Relation Age of Onset  . Diabetes Mother   . Heart  disease Brother   . Early death Brother     Social History:  reports that she has never smoked. She has never used smokeless tobacco. Her alcohol and drug histories are not on file.  Review of Systems:  She was previously on low dose medication for hypertension but this was stopped because of lower blood pressure  HYPERLIPIDEMIA: The lipid abnormality consists of elevated LDL, on simvastatin 40 mg for last few years with good control  Lab Results  Component Value Date   CHOL 125 07/20/2013   HDL 40.30 07/20/2013   LDLCALC 67 07/20/2013   TRIG 88.0 07/20/2013   CHOLHDL 3 07/20/2013   History of mild hypothyroidism, taking levothyroxine 50 mcg  Lab Results  Component Value Date   TSH 1.13 07/20/2013        Examination:   BP 122/62  Pulse 69  Temp(Src) 97.7 F (36.5 C)  Resp 16  Ht 4\' 11"  (1.499 m)  Wt 135 lb 12.8 oz (61.598 kg)  BMI 27.41 kg/m2  SpO2 98%  Body mass index is 27.41 kg/(m^2).   ASSESSMENT/ PLAN::   Diabetes type 2   The patient's diabetes control is much better with her recent weight loss. She has done better with Trulicity and Victoza possibly because of better compliance Also has been able to reduce her insulin Despite this however her fasting readings are low normal She is not checking readings after meals as directed however  Since she is probably going to do better with restarting exercise she can start cutting back on her insulin further and may be able to taper off completely. She can try 14 units for now She will start monitoring more readings after meals, be consistent her diet with reduced carbohydrates    Jennifer Baker 07/26/2013, 10:55 AM

## 2013-07-26 NOTE — Patient Instructions (Signed)
Please check blood sugars at least half the time about 2 hours after any meal and as directed on waking up. Please bring blood sugar monitor to each visit  Reduce Lantus to 14 units, stay on same dose unless am sugar stays > 110

## 2013-09-10 ENCOUNTER — Other Ambulatory Visit: Payer: Self-pay | Admitting: *Deleted

## 2013-09-10 MED ORDER — GLUCOSE BLOOD VI STRP: ORAL_STRIP | Status: DC

## 2013-10-30 ENCOUNTER — Other Ambulatory Visit (INDEPENDENT_AMBULATORY_CARE_PROVIDER_SITE_OTHER): Payer: Medicare Other

## 2013-10-30 DIAGNOSIS — E1165 Type 2 diabetes mellitus with hyperglycemia: Principal | ICD-10-CM

## 2013-10-30 DIAGNOSIS — IMO0001 Reserved for inherently not codable concepts without codable children: Secondary | ICD-10-CM

## 2013-10-30 LAB — COMPREHENSIVE METABOLIC PANEL
ALBUMIN: 4.3 g/dL (ref 3.5–5.2)
ALK PHOS: 47 U/L (ref 39–117)
ALT: 19 U/L (ref 0–35)
AST: 18 U/L (ref 0–37)
BILIRUBIN TOTAL: 0.1 mg/dL — AB (ref 0.2–1.2)
BUN: 12 mg/dL (ref 6–23)
CO2: 28 mEq/L (ref 19–32)
Calcium: 9.5 mg/dL (ref 8.4–10.5)
Chloride: 104 mEq/L (ref 96–112)
Creatinine, Ser: 0.7 mg/dL (ref 0.4–1.2)
GFR: 97.61 mL/min (ref >60.00)
Glucose, Bld: 99 mg/dL (ref 70–99)
Potassium: 3.7 mEq/L (ref 3.5–5.1)
Sodium: 138 mEq/L (ref 135–145)
Total Protein: 6.8 g/dL (ref 6.0–8.3)

## 2013-10-30 LAB — MICROALBUMIN / CREATININE URINE RATIO
CREATININE, U: 107 mg/dL
MICROALB/CREAT RATIO: 0.4 mg/g (ref 0.0–30.0)
Microalb, Ur: 0.4 mg/dL (ref 0.0–1.9)

## 2013-10-30 LAB — HEMOGLOBIN A1C: Hgb A1c MFr Bld: 6.6 % — ABNORMAL HIGH (ref 4.6–6.5)

## 2013-11-02 ENCOUNTER — Encounter: Payer: Self-pay | Admitting: Endocrinology

## 2013-11-02 ENCOUNTER — Ambulatory Visit (INDEPENDENT_AMBULATORY_CARE_PROVIDER_SITE_OTHER): Payer: Medicare Other | Admitting: Endocrinology

## 2013-11-02 VITALS — BP 113/64 | HR 73 | Temp 97.7°F | Resp 14 | Ht 59.0 in | Wt 140.0 lb

## 2013-11-02 DIAGNOSIS — E1165 Type 2 diabetes mellitus with hyperglycemia: Principal | ICD-10-CM

## 2013-11-02 DIAGNOSIS — E039 Hypothyroidism, unspecified: Secondary | ICD-10-CM

## 2013-11-02 DIAGNOSIS — IMO0001 Reserved for inherently not codable concepts without codable children: Secondary | ICD-10-CM

## 2013-11-02 NOTE — Progress Notes (Signed)
Patient ID: Jennifer Baker, female   DOB: 1951-01-10, 63 y.o.   MRN: [id number]   Reason for Appointment: Diabetes follow-up   History of Present Illness   Diagnosis: Type 2 DIABETES MELITUS, date of diagnosis:1997    She has been on basal insulin after a trial of metformin and Byetta and has been on Lantus for several years She had done better with adding Victoza in 05/2010 with weight loss, reduced insulin dose and better control Because of occasional noncompliance with Victoza she was switched to Trulicity in 76/16 With this she has had no side effects of nausea and is able to take it every Sunday She was able to cut back on her portions with starting this and initially lose more weight as well as reduce her insulin dose However more recently her weight is going back up and A1c is relative the higher Fasting readings are fairly normal and she has no hypoglycemia Again has not done any readings after meals as directed     Oral hypoglycemic drugs: Metformin ER 1500 mg        Side effects from medications: None Insulin regimen: Lantus 14 units hs          Proper timing of medications in relation to meals: Yes.          Monitors blood glucose:  irregularly.    Glucometer: One Touch.          Blood Glucose readings from meter download show fasting readings ranging from 88-110, median 100  Hypoglycemia frequency:  none recently.           Meals: 3 meals per day. Watching her portions but some sweets      Physical activity: exercise: walking, back and knee     Dietician visit: Most recent: 2012, also with husband in 0737         Complications: are: None      Wt Readings from Last 3 Encounters:  11/02/13 140 lb (63.504 kg)  07/26/13 135 lb 12.8 oz (61.598 kg)  03/27/13 145 lb 8 oz (65.998 kg)   LABS:  Lab Results  Component Value Date   HGBA1C 6.6* 10/30/2013   HGBA1C 6.1 07/20/2013   HGBA1C 6.7* 03/16/2013   Lab Results  Component Value Date   MICROALBUR 0.4 10/30/2013   LDLCALC 67  07/20/2013   CREATININE 0.7 10/30/2013      Medication List       This list is accurate as of: 11/02/13 11:10 AM.  Always use your most recent med list.               calcium carbonate 600 MG Tabs tablet  Commonly known as:  OS-CAL  Take 600 mg by mouth 2 (two) times daily with a meal.     Dulaglutide 1.5 MG/0.5ML Sopn  Commonly known as:  TRULICITY  Inject once a week     glucose blood test strip  Commonly known as:  ONE TOUCH ULTRA TEST  Use to check blood sugars three times per day dx code 250.02     Insulin Pen Needle 32G X 5 MM Misc  Commonly known as:  NOVOTWIST  Use 2 times daily. Dx code: 250.00     LANTUS SOLOSTAR 100 UNIT/ML Solostar Pen  Generic drug:  Insulin Glargine  INJECT 14 UNITS SUBCUTANEOUSLY DAILY     levothyroxine 50 MCG tablet  Commonly known as:  SYNTHROID, LEVOTHROID  Take 50 mcg by mouth daily before breakfast.  metFORMIN 500 MG 24 hr tablet  Commonly known as:  GLUCOPHAGE-XR  Take 3 tablets every morning     ONETOUCH DELICA LANCETS FINE Misc  1 each by Does not apply route 2 (two) times daily.     RESTASIS 0.05 % ophthalmic emulsion  Generic drug:  cycloSPORINE  Place 1 drop into both eyes every 12 (twelve) hours.     sertraline 50 MG tablet  Commonly known as:  ZOLOFT  Take 50 mg by mouth daily.     simvastatin 40 MG tablet  Commonly known as:  ZOCOR  Take 1 tablet (40 mg total) by mouth daily at 6 PM.     vitamin B-12 1000 MCG tablet  Commonly known as:  CYANOCOBALAMIN  Take 1,000 mcg by mouth daily.        Allergies: No Known Allergies  No past medical history on file.  Past Surgical History  Procedure Laterality Date  . Abdominal hysterectomy      Family History  Problem Relation Age of Onset  . Diabetes Mother   . Heart disease Brother   . Early death Brother     Social History:  reports that she has never smoked. She has never used smokeless tobacco. Her alcohol and drug histories are not on file.  Review  of Systems:  She was previously on low dose losartan for hypertension but this was stopped because of lower blood pressure  HYPERLIPIDEMIA: The lipid abnormality consists of elevated LDL, on simvastatin 40 mg for last few years with good control  Lab Results  Component Value Date   CHOL 125 07/20/2013   HDL 40.30 07/20/2013   LDLCALC 67 07/20/2013   TRIG 88.0 07/20/2013   CHOLHDL 3 07/20/2013   History of mild hypothyroidism, taking levothyroxine 50 mcg  Lab Results  Component Value Date   TSH 1.13 07/20/2013        Examination:   BP 113/64  Pulse 73  Temp(Src) 97.7 F (36.5 C)  Resp 14  Ht 4\' 11"  (1.499 m)  Wt 140 lb (63.504 kg)  BMI 28.26 kg/m2  SpO2 96%  Body mass index is 28.26 kg/(m^2).   ASSESSMENT/ PLAN:   Diabetes type 2   The patient's diabetes control is overall fairly good even though her A1c is relatively higher and her weight has gone back up some overall She has done better overall with Trulicity and Victoza possibly because of better compliance Unable to recently reduce her Lantus dose although still taking relatively small amount of 14 units  She is not checking readings after meals as directed however  She will try to watch her diet somewhat better with diabetes of foods she is eating and snacks She will start monitoring more readings after meals Also resume exercise as much as possible Consider reducing Lantus further if morning blood sugars tend to be lower  Patient Instructions  Please check blood sugars at least half the time about 2 hours after any meal and times per week on waking up. Please bring blood sugar monitor to each visit  Walk regularly    [facility] 11/02/2013, 11:10 AM

## 2013-11-02 NOTE — Patient Instructions (Signed)
Please check blood sugars at least half the time about 2 hours after any meal and times per week on waking up. Please bring blood sugar monitor to each visit  Walk regularly

## 2013-12-06 ENCOUNTER — Other Ambulatory Visit: Payer: Self-pay | Admitting: Endocrinology

## 2014-01-02 ENCOUNTER — Other Ambulatory Visit: Payer: Self-pay | Admitting: Endocrinology

## 2014-01-24 ENCOUNTER — Other Ambulatory Visit (INDEPENDENT_AMBULATORY_CARE_PROVIDER_SITE_OTHER): Payer: Medicare Other

## 2014-01-24 DIAGNOSIS — E039 Hypothyroidism, unspecified: Secondary | ICD-10-CM

## 2014-01-24 DIAGNOSIS — IMO0002 Reserved for concepts with insufficient information to code with codable children: Secondary | ICD-10-CM

## 2014-01-24 DIAGNOSIS — E1165 Type 2 diabetes mellitus with hyperglycemia: Secondary | ICD-10-CM

## 2014-01-24 LAB — LIPID PANEL
CHOL/HDL RATIO: 4
Cholesterol: 155 mg/dL (ref 0–200)
HDL: 43.3 mg/dL (ref >39.00)
LDL Cholesterol: 85 mg/dL (ref 0–99)
NONHDL: 111.7
Triglycerides: 134 mg/dL (ref 0.0–149.0)
VLDL: 26.8 mg/dL (ref 0.0–40.0)

## 2014-01-24 LAB — COMPREHENSIVE METABOLIC PANEL
ALBUMIN: 4.7 g/dL (ref 3.5–5.2)
ALT: 32 U/L (ref 0–35)
AST: 25 U/L (ref 0–37)
Alkaline Phosphatase: 50 U/L (ref 39–117)
BUN: 14 mg/dL (ref 6–23)
CO2: 28 mEq/L (ref 19–32)
Calcium: 9.6 mg/dL (ref 8.4–10.5)
Chloride: 105 mEq/L (ref 96–112)
Creatinine, Ser: 0.7 mg/dL (ref 0.4–1.2)
GFR: 85.31 mL/min (ref >60.00)
GLUCOSE: 92 mg/dL (ref 70–99)
POTASSIUM: 3.7 meq/L (ref 3.5–5.1)
SODIUM: 139 meq/L (ref 135–145)
TOTAL PROTEIN: 7.8 g/dL (ref 6.0–8.3)
Total Bilirubin: 0.4 mg/dL (ref 0.2–1.2)

## 2014-01-24 LAB — LDL CHOLESTEROL, DIRECT: Direct LDL: 92.3 mg/dL

## 2014-01-24 LAB — TSH: TSH: 1.81 u[IU]/mL (ref 0.35–4.50)

## 2014-01-24 LAB — HEMOGLOBIN A1C: Hgb A1c MFr Bld: 6.7 % — ABNORMAL HIGH (ref 4.6–6.5)

## 2014-02-04 ENCOUNTER — Encounter: Payer: Self-pay | Admitting: Endocrinology

## 2014-02-04 ENCOUNTER — Ambulatory Visit (INDEPENDENT_AMBULATORY_CARE_PROVIDER_SITE_OTHER): Payer: Medicare Other | Admitting: Endocrinology

## 2014-02-04 VITALS — BP 122/72 | HR 69 | Temp 97.6°F | Resp 14 | Ht 59.0 in | Wt 145.4 lb

## 2014-02-04 DIAGNOSIS — E038 Other specified hypothyroidism: Secondary | ICD-10-CM

## 2014-02-04 DIAGNOSIS — E785 Hyperlipidemia, unspecified: Secondary | ICD-10-CM

## 2014-02-04 DIAGNOSIS — E1165 Type 2 diabetes mellitus with hyperglycemia: Secondary | ICD-10-CM

## 2014-02-04 DIAGNOSIS — IMO0002 Reserved for concepts with insufficient information to code with codable children: Secondary | ICD-10-CM

## 2014-02-04 DIAGNOSIS — E063 Autoimmune thyroiditis: Secondary | ICD-10-CM

## 2014-02-04 NOTE — Progress Notes (Signed)
Patient ID: Jennifer Baker, female   DOB: June 25, 1950, 63 y.o.   MRN: [id number]   Reason for Appointment: Diabetes follow-up   History of Present Illness   Diagnosis: Type 2 DIABETES MELITUS, date of diagnosis:1997    She has been on basal insulin after a trial of metformin and Byetta and has been on Lantus for several years She had done better with adding Victoza in 05/2010 with weight loss, reduced insulin dose and better control Because of occasional noncompliance with Victoza she was switched to Trulicity in 40/35 With this she has had no side effects of nausea and is able to take it every Sunday  She was able to cut back on her portions with starting Trulicity but now appears to have had a progressive weight gain again She thinks this is from snacking too much and not eating the right kinds of foods A1c is relatively  higher and this was discussed with her today Fasting readings are fairly normal again and she has no hypoglycemia Again has not done readings after meals despite repeated reminders, has one blood sugar of 194 with eating rice     Oral hypoglycemic drug: Metformin ER 1500 mg        Side effects from medications: None Insulin regimen: Lantus 14- 16 units hs          Proper timing of medications in relation to meals: Yes.          Monitors blood glucose:  irregularly.    Glucometer: One Touch.          Blood Glucose readings from meter download show fasting readings ranging from 84-95, pc 194  Hypoglycemia frequency:  none recently.           Meals: 3 meals per day. Watching her portions at meals usually Physical activity: exercise: not walking, back and knee  pains     Dietician visit: Most recent: 2012, also with husband in 2481         Complications: are: None      Wt Readings from Last 3 Encounters:  02/04/14 145 lb 6.4 oz (65.953 kg)  11/02/13 140 lb (63.504 kg)  07/26/13 135 lb 12.8 oz (61.598 kg)   LABS:  Lab Results  Component Value Date   HGBA1C 6.7*  01/24/2014   HGBA1C 6.6* 10/30/2013   HGBA1C 6.1 07/20/2013   Lab Results  Component Value Date   MICROALBUR 0.4 10/30/2013   LDLCALC 85 01/24/2014   CREATININE 0.7 01/24/2014      Medication List       This list is accurate as of: 02/04/14  8:35 PM.  Always use your most recent med list.               B-COMPLEX PO  Take by mouth.     calcium carbonate 600 MG Tabs tablet  Commonly known as:  OS-CAL  Take 600 mg by mouth 2 (two) times daily with a meal.     Dulaglutide 1.5 MG/0.5ML Sopn  Commonly known as:  TRULICITY  Inject once a week     glucose blood test strip  Commonly known as:  ONE TOUCH ULTRA TEST  Use to check blood sugars three times per day dx code 250.02     Insulin Pen Needle 32G X 5 MM Misc  Commonly known as:  NOVOTWIST  Use 2 times daily. Dx code: 250.00     LANTUS SOLOSTAR 100 UNIT/ML Solostar Pen  Generic drug:  Insulin Glargine  INJECT 14 UNITS SUBCUTANEOUSLY DAILY     levothyroxine 50 MCG tablet  Commonly known as:  SYNTHROID, LEVOTHROID  Take 50 mcg by mouth daily before breakfast.     metFORMIN 500 MG 24 hr tablet  Commonly known as:  GLUCOPHAGE-XR  TAKE 3 TABLETS EVERY MORNING     ONETOUCH DELICA LANCETS FINE Misc  1 each by Does not apply route 2 (two) times daily.     RESTASIS 0.05 % ophthalmic emulsion  Generic drug:  cycloSPORINE  Place 1 drop into both eyes every 12 (twelve) hours.     sertraline 50 MG tablet  Commonly known as:  ZOLOFT  Take 50 mg by mouth daily.     simvastatin 40 MG tablet  Commonly known as:  ZOCOR  Take 1 tablet (40 mg total) by mouth daily at 6 PM.        Allergies: No Known Allergies  No past medical history on file.  Past Surgical History  Procedure Laterality Date  . Abdominal hysterectomy      Family History  Problem Relation Age of Onset  . Diabetes Mother   . Heart disease Brother   . Early death Brother     Social History:  reports that she has never smoked. She has never used  smokeless tobacco. Her alcohol and drug histories are not on file.  Review of Systems:  She was previously on low dose losartan for hypertension but this was stopped because of lower blood pressure  HYPERLIPIDEMIA: The lipid abnormality consists of elevated LDL, on simvastatin 40 mg for last few years with good control  Lab Results  Component Value Date   CHOL 155 01/24/2014   HDL 43.30 01/24/2014   LDLCALC 85 01/24/2014   LDLDIRECT 92.3 01/24/2014   TRIG 134.0 01/24/2014   CHOLHDL 4 01/24/2014   History of mild hypothyroidism, taking levothyroxine 50 mcg with consistent levels  Lab Results  Component Value Date   TSH 1.81 01/24/2014        Examination:   BP 122/72  Pulse 69  Temp(Src) 97.6 F (36.4 C)  Resp 14  Ht 4\' 11"  (1.499 m)  Wt 145 lb 6.4 oz (65.953 kg)  BMI 29.35 kg/m2  SpO2 98%  Body mass index is 29.35 kg/(m^2).   ASSESSMENT/ PLAN:   Diabetes type 2   The patient's diabetes control is overall fairly good but her A1c is relatively higher and her weight has gone back up further This is despite continuing Trulicity Although she has not been able to exercise in the past her diet appears to be less consistent and she can reduce smoking and make better choices Again difficult to know what her blood sugars are after meals, occasionally higher when she checks them increase carbohydrate intake  Plan:  She will try to watch her diet  better with lower carbohydrate intake and reduced snacks, discussed making better choices when doing her groceries She will start monitoring more readings after meals Also resume exercise when her lower back issues are resolved No change in Lantus unless fasting blood sugars are consistently higher or lower   Patient Instructions  Please check blood sugars at least half the time about 2 hours after any meal and 2 times per week on waking up. Please bring blood sugar monitor to each visit      Jermisha Hoffart 02/04/2014, 8:35 PM

## 2014-02-04 NOTE — Patient Instructions (Addendum)
Please check blood sugars at least half the time about 2 hours after any meal and 2 times per week on waking up. Please bring blood sugar monitor to each visit  

## 2014-02-07 ENCOUNTER — Other Ambulatory Visit: Payer: Self-pay | Admitting: Neurosurgery

## 2014-02-07 DIAGNOSIS — M545 Low back pain, unspecified: Secondary | ICD-10-CM

## 2014-02-07 DIAGNOSIS — G8929 Other chronic pain: Secondary | ICD-10-CM

## 2014-02-12 ENCOUNTER — Ambulatory Visit
Admission: RE | Admit: 2014-02-12 | Discharge: 2014-02-12 | Disposition: A | Discharge status: Home / Self Care | Payer: Medicare Other | Source: Ambulatory Visit | Attending: Neurosurgery | Admitting: Neurosurgery

## 2014-02-12 VITALS — BP 102/54 | HR 61

## 2014-02-12 DIAGNOSIS — G8929 Other chronic pain: Secondary | ICD-10-CM

## 2014-02-12 DIAGNOSIS — M545 Low back pain, unspecified: Secondary | ICD-10-CM

## 2014-02-12 MED ORDER — IOHEXOL 300 MG/ML  SOLN
10.0000 mL | Freq: Once | INTRAMUSCULAR | Status: AC | PRN
  Administered 2014-02-12: 10 mL via INTRATHECAL

## 2014-02-12 MED ORDER — DIAZEPAM 5 MG PO TABS
10.0000 mg | ORAL_TABLET | Freq: Once | ORAL | Status: AC
  Administered 2014-02-12: 10 mg via ORAL

## 2014-02-12 NOTE — Progress Notes (Addendum)
Pt has been off zoloft since Saturday.  Discharge instructions explained to pt.

## 2014-02-12 NOTE — Discharge Instructions (Signed)
Myelogram Discharge Instructions  1. Go home and rest quietly for the next 24 hours.  It is important to lie flat for the next 24 hours.  Get up only to go to the restroom.  You may lie in the bed or on a couch on your back, your stomach, your left side or your right side.  You may have one pillow under your head.  You may have pillows between your knees while you are on your side or under your knees while you are on your back.  2. DO NOT drive today.  Recline the seat as far back as it will go, while still wearing your seat belt, on the way home.  3. You may get up to go to the bathroom as needed.  You may sit up for 10 minutes to eat.  You may resume your normal diet and medications unless otherwise indicated.  Drink lots of extra fluids today and tomorrow.  4. The incidence of headache, nausea, or vomiting is about 5% (one in 20 patients).  If you develop a headache, lie flat and drink plenty of fluids until the headache goes away.  Caffeinated beverages may be helpful.  If you develop severe nausea and vomiting or a headache that does not go away with flat bed rest, call [phone].  5. You may resume normal activities after your 24 hours of bed rest is over; however, do not exert yourself strongly or do any heavy lifting tomorrow. If when you get up you have a headache when standing, go back to bed and force fluids for another 24 hours.  6. Call your physician for a follow-up appointment.  The results of your myelogram will be sent directly to your physician by the following day.  7. If you have any questions or if complications develop after you arrive home, please call [phone].  Discharge instructions have been explained to the patient.  The patient, or the person responsible for the patient, fully understands these instructions.      May resume Sertraline on Oct. 21, 2015, after 9:30 am.

## 2014-02-22 ENCOUNTER — Other Ambulatory Visit: Payer: Self-pay | Admitting: *Deleted

## 2014-02-22 MED ORDER — INSULIN PEN NEEDLE 32G X 5 MM MISC: Status: DC

## 2014-02-22 MED ORDER — ONETOUCH DELICA LANCETS FINE MISC: 1.0000 | Freq: Two times a day (BID) | Status: DC

## 2014-02-24 ENCOUNTER — Other Ambulatory Visit: Payer: Self-pay | Admitting: Endocrinology

## 2014-03-06 ENCOUNTER — Other Ambulatory Visit: Payer: Self-pay | Admitting: Neurosurgery

## 2014-03-06 DIAGNOSIS — M5127 Other intervertebral disc displacement, lumbosacral region: Secondary | ICD-10-CM

## 2014-03-08 ENCOUNTER — Ambulatory Visit
Admission: RE | Admit: 2014-03-08 | Discharge: 2014-03-08 | Disposition: A | Discharge status: Home / Self Care | Payer: Medicare Other | Source: Ambulatory Visit | Attending: Neurosurgery | Admitting: Neurosurgery

## 2014-03-08 DIAGNOSIS — M5127 Other intervertebral disc displacement, lumbosacral region: Secondary | ICD-10-CM

## 2014-03-08 MED ORDER — METHYLPREDNISOLONE ACETATE 40 MG/ML INJ SUSP (RADIOLOG
120.0000 mg | Freq: Once | INTRAMUSCULAR | Status: AC
  Administered 2014-03-08: 120 mg via EPIDURAL

## 2014-03-08 MED ORDER — IOHEXOL 180 MG/ML  SOLN
1.0000 mL | Freq: Once | INTRAMUSCULAR | Status: AC | PRN
  Administered 2014-03-08: 1 mL via EPIDURAL

## 2014-03-08 NOTE — Discharge Instructions (Signed)

## 2014-05-02 ENCOUNTER — Other Ambulatory Visit (INDEPENDENT_AMBULATORY_CARE_PROVIDER_SITE_OTHER): Payer: Medicare Other

## 2014-05-02 DIAGNOSIS — E1165 Type 2 diabetes mellitus with hyperglycemia: Secondary | ICD-10-CM

## 2014-05-02 DIAGNOSIS — IMO0002 Reserved for concepts with insufficient information to code with codable children: Secondary | ICD-10-CM

## 2014-05-02 LAB — BASIC METABOLIC PANEL
BUN: 12 mg/dL (ref 6–23)
CALCIUM: 9.4 mg/dL (ref 8.4–10.5)
CO2: 27 mEq/L (ref 19–32)
Chloride: 105 mEq/L (ref 96–112)
Creatinine, Ser: 0.6 mg/dL (ref 0.4–1.2)
GFR: 99.22 mL/min (ref >60.00)
Glucose, Bld: 91 mg/dL (ref 70–99)
Potassium: 3.6 mEq/L (ref 3.5–5.1)
Sodium: 139 mEq/L (ref 135–145)

## 2014-05-02 LAB — HEMOGLOBIN A1C: HEMOGLOBIN A1C: 6.8 % — AB (ref 4.6–6.5)

## 2014-05-08 ENCOUNTER — Ambulatory Visit (INDEPENDENT_AMBULATORY_CARE_PROVIDER_SITE_OTHER): Payer: Medicare Other | Admitting: Endocrinology

## 2014-05-08 ENCOUNTER — Encounter: Payer: Self-pay | Admitting: Endocrinology

## 2014-05-08 VITALS — BP 127/67 | HR 64 | Temp 97.9°F | Resp 14 | Ht 59.0 in | Wt 142.4 lb

## 2014-05-08 DIAGNOSIS — E063 Autoimmune thyroiditis: Secondary | ICD-10-CM

## 2014-05-08 DIAGNOSIS — E119 Type 2 diabetes mellitus without complications: Secondary | ICD-10-CM

## 2014-05-08 DIAGNOSIS — E038 Other specified hypothyroidism: Secondary | ICD-10-CM

## 2014-05-08 NOTE — Progress Notes (Signed)
Patient ID: Jennifer Baker, female   DOB: 12-22-1950, 64 y.o.   MRN: [id number]   Reason for Appointment: Diabetes follow-up   History of Present Illness   Diagnosis: Type 2 DIABETES MELITUS, date of diagnosis:1997    She has been on basal insulin after a trial of metformin and Byetta and has been on Lantus for several years She had done better with adding Victoza in 05/2010 with weight loss, reduced insulin dose and better control Because of occasional noncompliance with Victoza she was switched to Trulicity in 84/16 With this she has had no side effects of nausea and is able to take it every Sunday  She was able to cut back on her portions with starting Trulicity but more recently has had difficulty keeping the weight off because of difficulty reducing snacks and not watching the types of meals She is again trying a low carb diet which she does once a year A1c is relatively higher  Fasting readings are fairly normal again and she has no hypoglycemia Again has not done readings after meals despite repeated reminders, has one blood sugar of 212 after a higher fat meals She thinks blood sugar will go up significantly with eating rice and she does not do this often She also may have had a little higher readings in November with an epidural injection     Oral hypoglycemic drug: Metformin ER 1500 mg        Side effects from medications: None Insulin regimen: Lantus 14 units hs          Proper timing of medications in relation to meals: Yes.          Monitors blood glucose:  irregularly.    Glucometer: One Touch.          Blood Glucose readings from meter download show fasting readings ranging from 81-89, evening readings 94-110, highest reading 212 at midnight    Hypoglycemia frequency:  none recently.           Meals: 3 meals per day. Watching her portions at meals usually Physical activity: exercise: not walking Dietician visit: Most recent: 2012, also with husband in [id number]          Complications: are: None     Wt Readings from Last 3 Encounters:  05/08/14 142 lb 6.4 oz (64.592 kg)  02/04/14 145 lb 6.4 oz (65.953 kg)  11/02/13 140 lb (63.504 kg)   LABS:  Lab Results  Component Value Date   HGBA1C 6.8* 05/02/2014   HGBA1C 6.7* 01/24/2014   HGBA1C 6.6* 10/30/2013   Lab Results  Component Value Date   MICROALBUR 0.4 10/30/2013   LDLCALC 85 01/24/2014   CREATININE 0.6 05/02/2014      Medication List       This list is accurate as of: 05/08/14 11:43 AM.  Always use your most recent med list.               B-COMPLEX PO  Take by mouth.     calcium carbonate 600 MG Tabs tablet  Commonly known as:  OS-CAL  Take 600 mg by mouth 2 (two) times daily with a meal.     Dulaglutide 1.5 MG/0.5ML Sopn  Commonly known as:  TRULICITY  Inject once a week     glucose blood test strip  Commonly known as:  ONE TOUCH ULTRA TEST  Use to check blood sugars three times per day dx code 250.02     Insulin Pen Needle 32G X  5 MM Misc  Commonly known as:  NOVOTWIST  Use 2 times daily. Dx code: E11.65     LANTUS SOLOSTAR 100 UNIT/ML Solostar Pen  Generic drug:  Insulin Glargine  INJECT 14 UNITS SUBCUTANEOUSLY DAILY     levothyroxine 50 MCG tablet  Commonly known as:  SYNTHROID, LEVOTHROID  Take 50 mcg by mouth daily before breakfast.     metFORMIN 500 MG 24 hr tablet  Commonly known as:  GLUCOPHAGE-XR  TAKE 3 TABLETS EVERY MORNING     ONETOUCH DELICA LANCETS FINE Misc  1 each by Does not apply route 2 (two) times daily.     PREMARIN vaginal cream  Generic drug:  conjugated estrogens     RESTASIS 0.05 % ophthalmic emulsion  Generic drug:  cycloSPORINE  Place 1 drop into both eyes every 12 (twelve) hours.     sertraline 50 MG tablet  Commonly known as:  ZOLOFT  Take 50 mg by mouth daily.     simvastatin 40 MG tablet  Commonly known as:  ZOCOR  Take 1 tablet (40 mg total) by mouth daily at 6 PM.        Allergies: No Known Allergies  No past  medical history on file.  Past Surgical History  Procedure Laterality Date  . Abdominal hysterectomy      Family History  Problem Relation Age of Onset  . Diabetes Mother   . Heart disease Brother   . Early death Brother     Social History:  reports that she has never smoked. She has never used smokeless tobacco. Her alcohol and drug histories are not on file.  Review of Systems:  No history of recent hypertension  HYPERLIPIDEMIA: The lipid abnormality consists of elevated LDL, on simvastatin 40 mg for last few years with good control  Lab Results  Component Value Date   CHOL 155 01/24/2014   HDL 43.30 01/24/2014   LDLCALC 85 01/24/2014   LDLDIRECT 92.3 01/24/2014   TRIG 134.0 01/24/2014   CHOLHDL 4 01/24/2014   History of mild hypothyroidism, taking levothyroxine 50 mcg with consistent levels  Lab Results  Component Value Date   TSH 1.81 01/24/2014   Had epidural in 11/15 for low back pain     Examination:   BP 127/67 mmHg  Pulse 64  Temp(Src) 97.9 F (36.6 C)  Resp 14  Ht 4\' 11"  (1.499 m)  Wt 142 lb 6.4 oz (64.592 kg)  BMI 28.75 kg/m2  SpO2 98%  Body mass index is 28.75 kg/(m^2).   ASSESSMENT/ PLAN:   Diabetes type 2   The patient's diabetes control is overall only fair with A1c higher than usual Most likely she has postprandial hyperglycemia and again she does not monitor this She does not always watch her fat intake and snacks This is despite continuing Trulicity and being regular with this Although she has not been able to exercise even though she can do this now  Plan:  She will try to watch her diet  better with lower carbohydrate and fat intake even after she goes off her low-carb semi-fast diet this month She will start monitoring more readings after meals Also resume exercise   No change in Lantus unless fasting blood sugars are consistently higher or lower, for now she can try 12 units with her low-carb diet  Khing Belcher 05/08/2014, 11:43  AM

## 2014-05-08 NOTE — Patient Instructions (Signed)
Please check blood sugars at least half the time about 2 hours after any meal and 3 times per week on waking up. Please bring blood sugar monitor to each visit  Start walking 5 days a week

## 2014-05-10 ENCOUNTER — Other Ambulatory Visit: Payer: Self-pay

## 2014-05-10 DIAGNOSIS — Z1231 Encounter for screening mammogram for malignant neoplasm of breast: Secondary | ICD-10-CM

## 2014-05-30 ENCOUNTER — Ambulatory Visit
Admission: RE | Admit: 2014-05-30 | Discharge: 2014-05-30 | Disposition: A | Discharge status: Home / Self Care | Payer: Medicare Other | Source: Ambulatory Visit

## 2014-05-30 DIAGNOSIS — Z1231 Encounter for screening mammogram for malignant neoplasm of breast: Secondary | ICD-10-CM

## 2014-08-06 ENCOUNTER — Other Ambulatory Visit: Payer: Self-pay | Admitting: Endocrinology

## 2014-08-29 ENCOUNTER — Other Ambulatory Visit (INDEPENDENT_AMBULATORY_CARE_PROVIDER_SITE_OTHER): Payer: Medicare Other

## 2014-08-29 DIAGNOSIS — E119 Type 2 diabetes mellitus without complications: Secondary | ICD-10-CM | POA: Diagnosis not present

## 2014-08-29 DIAGNOSIS — E063 Autoimmune thyroiditis: Secondary | ICD-10-CM

## 2014-08-29 DIAGNOSIS — E038 Other specified hypothyroidism: Secondary | ICD-10-CM

## 2014-08-29 LAB — COMPREHENSIVE METABOLIC PANEL
ALT: 29 U/L (ref 0–35)
AST: 20 U/L (ref 0–37)
Albumin: 4 g/dL (ref 3.5–5.2)
Alkaline Phosphatase: 43 U/L (ref 39–117)
BUN: 11 mg/dL (ref 6–23)
CO2: 29 meq/L (ref 19–32)
Calcium: 9.7 mg/dL (ref 8.4–10.5)
Chloride: 103 mEq/L (ref 96–112)
Creatinine, Ser: 0.71 mg/dL (ref 0.40–1.20)
GFR: 87.93 mL/min (ref >60.00)
Glucose, Bld: 98 mg/dL (ref 70–99)
Potassium: 3.6 mEq/L (ref 3.5–5.1)
Sodium: 138 mEq/L (ref 135–145)
Total Bilirubin: 0.4 mg/dL (ref 0.2–1.2)
Total Protein: 6.6 g/dL (ref 6.0–8.3)

## 2014-08-29 LAB — HEMOGLOBIN A1C: Hgb A1c MFr Bld: 6.5 % (ref 4.6–6.5)

## 2014-08-29 LAB — TSH: TSH: 2.23 u[IU]/mL (ref 0.35–4.50)

## 2014-09-03 ENCOUNTER — Ambulatory Visit (INDEPENDENT_AMBULATORY_CARE_PROVIDER_SITE_OTHER): Payer: Medicare Other | Admitting: Endocrinology

## 2014-09-03 ENCOUNTER — Encounter: Payer: Self-pay | Admitting: Endocrinology

## 2014-09-03 VITALS — BP 124/62 | HR 68 | Temp 97.8°F | Resp 14 | Ht 59.0 in | Wt 145.4 lb

## 2014-09-03 DIAGNOSIS — E119 Type 2 diabetes mellitus without complications: Secondary | ICD-10-CM

## 2014-09-03 DIAGNOSIS — E038 Other specified hypothyroidism: Secondary | ICD-10-CM | POA: Diagnosis not present

## 2014-09-03 DIAGNOSIS — E063 Autoimmune thyroiditis: Secondary | ICD-10-CM

## 2014-09-03 DIAGNOSIS — E785 Hyperlipidemia, unspecified: Secondary | ICD-10-CM

## 2014-09-03 NOTE — Progress Notes (Signed)
Patient ID: Jennifer Baker, female   DOB: 01-30-1951, 64 y.o.   MRN: [id number]   Reason for Appointment: Diabetes follow-up   History of Present Illness   Diagnosis: Type 2 DIABETES MELITUS, date of diagnosis:1997    She has been on basal insulin after a trial of metformin and Byetta and has been on Lantus for several years She had done better with adding Victoza in 05/2010 with weight loss, reduced insulin dose and better control Because of occasional noncompliance with Victoza she was switched to Trulicity in 69/48 With this she has had no side effects of nausea and is able to take it every Sunday  She was able to cut back on her portions with starting Trulicity   However her weight has been fluctuating and is trending higher now.   Current blood sugar patterns and problems identified:   She has checked her blood sugar only 3 times in the last month , highest reading 114 with relatively higher fat meals last night    Her A1c is slightly better at 6.5   She has not had any need to change her insulin and no hypoglycemia, still taking relatively small doses  She says that she has missed 2 injections of Trulicity recently as she did not pick up her prescription but still tends to have some symptoms of satiety.   She is not able to exercise much partly because of her back problems but also is not motivated Again has not done readings after meals despite repeated reminders      Oral hypoglycemic drug: Metformin ER 1500 mg        Side effects from medications: None Insulin regimen: Lantus 14 units hs          Proper timing of medications in relation to meals: Yes.          Monitors blood glucose:  irregularly.    Glucometer: One Touch.          Blood Glucose readings from meter download show fasting readings ranging from93-114, has only 3 readings   Hypoglycemia frequency:  none recently.           Meals: 3 meals per day. Watching her portions at meals usually Physical activity:  exercise:  none Dietician visit: Most recent: 2012, also with husband in [id number]         Complications: are: None     Wt Readings from Last 3 Encounters:  09/03/14 145 lb 6.4 oz (65.953 kg)  05/08/14 142 lb 6.4 oz (64.592 kg)  02/04/14 145 lb 6.4 oz (65.953 kg)   LABS:  Lab Results  Component Value Date   HGBA1C 6.5 08/29/2014   HGBA1C 6.8* 05/02/2014   HGBA1C 6.7* 01/24/2014   Lab Results  Component Value Date   MICROALBUR 0.4 10/30/2013   LDLCALC 85 01/24/2014   CREATININE 0.71 08/29/2014      Medication List       This list is accurate as of: 09/03/14 10:43 AM.  Always use your most recent med list.               B-COMPLEX PO  Take by mouth.     calcium carbonate 600 MG Tabs tablet  Commonly known as:  OS-CAL  Take 600 mg by mouth 2 (two) times daily with a meal.     Dulaglutide 1.5 MG/0.5ML Sopn  Commonly known as:  TRULICITY  Inject once a week     glucose blood test strip  Commonly known  as:  ONE TOUCH ULTRA TEST  Use to check blood sugars three times per day dx code 250.02     Insulin Pen Needle 32G X 5 MM Misc  Commonly known as:  NOVOTWIST  Use 2 times daily. Dx code: E11.65     LANTUS SOLOSTAR 100 UNIT/ML Solostar Pen  Generic drug:  Insulin Glargine  INJECT 12 UNITS SUBCUTANEOUSLY DAILY     levothyroxine 50 MCG tablet  Commonly known as:  SYNTHROID, LEVOTHROID  Take 50 mcg by mouth daily before breakfast.     metFORMIN 500 MG 24 hr tablet  Commonly known as:  GLUCOPHAGE-XR  TAKE 3 TABLETS EVERY MORNING     ONETOUCH DELICA LANCETS FINE Misc  1 each by Does not apply route 2 (two) times daily.     PREMARIN vaginal cream  Generic drug:  conjugated estrogens     RESTASIS 0.05 % ophthalmic emulsion  Generic drug:  cycloSPORINE  Place 1 drop into both eyes every 12 (twelve) hours.     sertraline 50 MG tablet  Commonly known as:  ZOLOFT  Take 50 mg by mouth daily.     simvastatin 40 MG tablet  Commonly known as:  ZOCOR  Take 1 tablet  (40 mg total) by mouth daily at 6 PM.     triamcinolone cream 0.1 %  Commonly known as:  KENALOG        Allergies: No Known Allergies  No past medical history on file.  Past Surgical History  Procedure Laterality Date  . Abdominal hysterectomy      Family History  Problem Relation Age of Onset  . Diabetes Mother   . Heart disease Brother   . Early death Brother     Social History:  reports that she has never smoked. She has never used smokeless tobacco. Her alcohol and drug histories are not on file.  Review of Systems:  No history of recent hypertension  HYPERLIPIDEMIA: The lipid abnormality consists of elevated LDL, on simvastatin 40 mg for last few years with good control  Lab Results  Component Value Date   CHOL 155 01/24/2014   HDL 43.30 01/24/2014   LDLCALC 85 01/24/2014   LDLDIRECT 92.3 01/24/2014   TRIG 134.0 01/24/2014   CHOLHDL 4 01/24/2014   History of mild hypothyroidism, taking levothyroxine 50 mcg with consistent levels  Lab Results  Component Value Date   TSH 2.23 08/29/2014       Examination:   BP 124/62 mmHg  Pulse 68  Temp(Src) 97.8 F (36.6 C)  Resp 14  Ht 4\' 11"  (1.499 m)  Wt 145 lb 6.4 oz (65.953 kg)  BMI 29.35 kg/m2  SpO2 97%  Body mass index is 29.35 kg/(m^2).   Diabetic foot exam shows normal monofilament sensation in the toes and plantar surface except on the left fifth toe distally, no skin lesions or ulcers on the feet and normal pedal pulses   ASSESSMENT/ PLAN:   Diabetes type 2   The patient's diabetes control is slightly better with A1c 6.5 This is despite her not exercising, gaining a little weight, not taking Trulicity in the last 2-3 weeks and not consistently watching her diet Discussed that she probably has some postprandial hyperglycemia without Trulicity as she is taking only basal insulin and metformin and would recommend continuing this.  She may be able to get by with smaller doses but she wants to continue  the same dose  Plan:  She will try to walk with her husband  Restart Trulicity weekly More readings after meals  Jennifer Baker 09/03/2014, 10:43 AM

## 2014-09-03 NOTE — Patient Instructions (Addendum)
Please check blood sugars at least half the time about 2 hours after any meal and 3 times per week on waking up.  Please bring blood sugar monitor to each visit. Recommended blood sugar levels about 2 hours after meal is 140-180 and on waking up 90-130  WALK 3X weekly  Restart Trulicity

## 2014-10-28 ENCOUNTER — Other Ambulatory Visit: Payer: Self-pay | Admitting: Endocrinology

## 2014-11-15 ENCOUNTER — Other Ambulatory Visit: Payer: Self-pay | Admitting: *Deleted

## 2014-11-15 MED ORDER — INSULIN PEN NEEDLE 32G X 5 MM MISC: Status: DC

## 2014-11-29 ENCOUNTER — Other Ambulatory Visit: Payer: Self-pay | Admitting: *Deleted

## 2014-11-29 MED ORDER — DULAGLUTIDE 1.5 MG/0.5ML ~~LOC~~ SOAJ: SUBCUTANEOUS | Status: DC

## 2014-12-31 ENCOUNTER — Other Ambulatory Visit (INDEPENDENT_AMBULATORY_CARE_PROVIDER_SITE_OTHER): Payer: Medicare Other

## 2014-12-31 DIAGNOSIS — E119 Type 2 diabetes mellitus without complications: Secondary | ICD-10-CM

## 2014-12-31 LAB — COMPREHENSIVE METABOLIC PANEL
ALBUMIN: 4.4 g/dL (ref 3.5–5.2)
ALT: 46 U/L — ABNORMAL HIGH (ref 0–35)
AST: 26 U/L (ref 0–37)
Alkaline Phosphatase: 41 U/L (ref 39–117)
BUN: 15 mg/dL (ref 6–23)
CALCIUM: 9.7 mg/dL (ref 8.4–10.5)
CHLORIDE: 106 meq/L (ref 96–112)
CO2: 29 meq/L (ref 19–32)
Creatinine, Ser: 0.7 mg/dL (ref 0.40–1.20)
GFR: 89.28 mL/min (ref >60.00)
Glucose, Bld: 90 mg/dL (ref 70–99)
POTASSIUM: 4 meq/L (ref 3.5–5.1)
Sodium: 141 mEq/L (ref 135–145)
Total Bilirubin: 0.4 mg/dL (ref 0.2–1.2)
Total Protein: 6.9 g/dL (ref 6.0–8.3)

## 2014-12-31 LAB — LIPID PANEL
CHOLESTEROL: 117 mg/dL (ref 0–200)
HDL: 38.1 mg/dL — ABNORMAL LOW (ref >39.00)
LDL CALC: 65 mg/dL (ref 0–99)
NonHDL: 79.16
TRIGLYCERIDES: 70 mg/dL (ref 0.0–149.0)
Total CHOL/HDL Ratio: 3
VLDL: 14 mg/dL (ref 0.0–40.0)

## 2014-12-31 LAB — HEMOGLOBIN A1C: Hgb A1c MFr Bld: 6.6 % — ABNORMAL HIGH (ref 4.6–6.5)

## 2015-01-03 ENCOUNTER — Ambulatory Visit: Payer: Medicare Other | Admitting: Endocrinology

## 2015-01-08 ENCOUNTER — Encounter: Payer: Self-pay | Admitting: Endocrinology

## 2015-01-08 ENCOUNTER — Ambulatory Visit (INDEPENDENT_AMBULATORY_CARE_PROVIDER_SITE_OTHER): Payer: Medicare Other | Admitting: Endocrinology

## 2015-01-08 VITALS — BP 110/70 | HR 66 | Temp 97.9°F | Resp 14 | Ht 59.0 in | Wt 139.8 lb

## 2015-01-08 DIAGNOSIS — E119 Type 2 diabetes mellitus without complications: Secondary | ICD-10-CM | POA: Diagnosis not present

## 2015-01-08 DIAGNOSIS — E038 Other specified hypothyroidism: Secondary | ICD-10-CM

## 2015-01-08 DIAGNOSIS — Z23 Encounter for immunization: Secondary | ICD-10-CM | POA: Diagnosis not present

## 2015-01-08 DIAGNOSIS — E785 Hyperlipidemia, unspecified: Secondary | ICD-10-CM

## 2015-01-08 DIAGNOSIS — E063 Autoimmune thyroiditis: Secondary | ICD-10-CM

## 2015-01-08 NOTE — Progress Notes (Signed)
Patient ID: Jennifer Baker, female   DOB: 1951/04/11, 64 y.o.   MRN: [id number]   Reason for Appointment: Diabetes follow-up   History of Present Illness   Diagnosis: Type 2 DIABETES MELITUS, date of diagnosis:1997    She has been on basal insulin after a trial of metformin and Byetta and has been on Lantus for several years She had done better with adding Victoza in 05/2010 with weight loss, reduced insulin dose and better control Because of occasional noncompliance with Victoza she was switched to Trulicity in 49/67 With this she has had no side effects of nausea and is able to take it every Sunday  She was able to cut back on her portions with starting Trulicity  On her last visit she had been irregular with her Trulicity but has been taking it regularly now .   Current blood sugar patterns and problems identified:   She has checked her blood sugar more often since her last visit and also more readings in the afternoon and evenings and these are excellent  She has low normal fasting readings even though she is taking only 12 units of insulin  She still is not able to exercise because of back pain and also not able to do much housework  No hypoglycemia     Oral hypoglycemic drug: Metformin ER 1500 mg        Side effects from medications: None Insulin regimen: Lantus 12 units hs          Proper timing of medications in relation to meals: Yes.          Monitors blood glucose:  irregularly.    Glucometer: One Touch.          Blood Glucose readings from meter download show   Mean values apply above for all meters except median for One Touch  PRE-MEAL Fasting Lunch Dinner Bedtime Overall  Glucose range:  77-97   84, 101    107, 121    Mean/median:     93      Hypoglycemia frequency:  none recently.           Meals: 3 meals per day. Watching her portions at meals usually Physical activity: exercise:  none Dietician visit: Most recent: 2012, also with husband in [id number]          Complications: are: None     Wt Readings from Last 3 Encounters:  01/08/15 139 lb 12.8 oz (63.413 kg)  09/03/14 145 lb 6.4 oz (65.953 kg)  05/08/14 142 lb 6.4 oz (64.592 kg)   LABS:  Lab Results  Component Value Date   HGBA1C 6.6* 12/31/2014   HGBA1C 6.5 08/29/2014   HGBA1C 6.8* 05/02/2014   Lab Results  Component Value Date   MICROALBUR 0.4 10/30/2013   LDLCALC 65 12/31/2014   CREATININE 0.70 12/31/2014      Medication List       This list is accurate as of: 01/08/15 11:59 PM.  Always use your most recent med list.               B-COMPLEX PO  Take by mouth.     calcium carbonate 600 MG Tabs tablet  Commonly known as:  OS-CAL  Take 600 mg by mouth 2 (two) times daily with a meal.     [employer] PRENATAL GUMMY 0.4-113.5 MG Chew  Chew by mouth.     Dulaglutide 1.5 MG/0.5ML Sopn  Commonly known as:  TRULICITY  Inject once a week  glucose blood test strip  Commonly known as:  ONE TOUCH ULTRA TEST  Use to check blood sugars three times per day dx code 250.02     Insulin Pen Needle 32G X 5 MM Misc  Commonly known as:  NOVOTWIST  Use 2 times daily. Dx code: E11.65     LANTUS SOLOSTAR 100 UNIT/ML Solostar Pen  Generic drug:  Insulin Glargine  INJECT 12 UNITS SUBCUTANEOUSLY DAILY     levothyroxine 50 MCG tablet  Commonly known as:  SYNTHROID, LEVOTHROID  Take 50 mcg by mouth daily before breakfast.     metFORMIN 500 MG 24 hr tablet  Commonly known as:  GLUCOPHAGE-XR  TAKE 3 TABLETS EVERY MORNING     ONETOUCH DELICA LANCETS FINE Misc  1 each by Does not apply route 2 (two) times daily.     PREMARIN vaginal cream  Generic drug:  conjugated estrogens     RESTASIS 0.05 % ophthalmic emulsion  Generic drug:  cycloSPORINE  Place 1 drop into both eyes every 12 (twelve) hours.     sertraline 50 MG tablet  Commonly known as:  ZOLOFT  Take 50 mg by mouth daily.     simvastatin 40 MG tablet  Commonly known as:  ZOCOR  Take 1 tablet (40 mg total) by mouth  daily at 6 PM.     triamcinolone cream 0.1 %  Commonly known as:  KENALOG        Allergies: No Known Allergies  No past medical history on file.  Past Surgical History  Procedure Laterality Date  . Abdominal hysterectomy      Family History  Problem Relation Age of Onset  . Diabetes Mother   . Heart disease Brother   . Early death Brother     Social History:  reports that she has never smoked. She has never used smokeless tobacco. Her alcohol and drug histories are not on file.  Review of Systems:  She has difficulty with hearing and some dizziness also, more like vertigo  HYPERLIPIDEMIA: The lipid abnormality consists of elevated LDL, on simvastatin 40 mg for last few years with good control  Lab Results  Component Value Date   CHOL 117 12/31/2014   HDL 38.10* 12/31/2014   LDLCALC 65 12/31/2014   LDLDIRECT 92.3 01/24/2014   TRIG 70.0 12/31/2014   CHOLHDL 3 12/31/2014   History of mild hypothyroidism, taking levothyroxine 50 mcg with consistent levels  Lab Results  Component Value Date   TSH 2.23 08/29/2014     Diabetic foot exam in 5/16 shows normal monofilament sensation in the toes and plantar surface except on the left fifth toe distally, no skin lesions or ulcers on the feet and normal pedal pulses    Examination:   BP 110/70 mmHg  Pulse 66  Temp(Src) 97.9 F (36.6 C)  Resp 14  Ht 4\' 11"  (1.499 m)  Wt 139 lb 12.8 oz (63.413 kg)  BMI 28.22 kg/m2  SpO2 97%  Body mass index is 28.22 kg/(m^2).   No pedal edema   ASSESSMENT/ PLAN:   Diabetes type 2   The patient's diabetes control is excellent with fairly normal home readings although her A1c is relatively higher than expected at 6.6 She hasn't has however been regular with her Trulicity and has lost a little weight Still not able to exercise because of her back pain Her fasting readings are low normal and did not have postprandial hyperglycemia  HYPERCHOLESTEROLEMIA: Excellent control  although HDL is relatively lower than  usual  Plan:  She will reduce her insulin by 2 units at least Try to exercise when she is able to Follow-up with PCP for vertigo Check thyroid levels on the next visit Regular eye exams Influenza vaccine given today  [facility] 01/09/2015, 12:47 PM

## 2015-01-08 NOTE — Patient Instructions (Addendum)
Lantus 10 units, if am sugar stays <90 then reduce to 8 units  Do more sugars 2 hrs after meals

## 2015-01-09 ENCOUNTER — Other Ambulatory Visit: Payer: Self-pay | Admitting: *Deleted

## 2015-01-09 DIAGNOSIS — Z23 Encounter for immunization: Secondary | ICD-10-CM

## 2015-02-28 ENCOUNTER — Encounter: Payer: Self-pay | Admitting: Gastroenterology

## 2015-03-28 ENCOUNTER — Encounter: Payer: Self-pay | Admitting: Gastroenterology

## 2015-04-08 LAB — HM DIABETES EYE EXAM

## 2015-04-09 ENCOUNTER — Encounter: Payer: Self-pay | Admitting: *Deleted

## 2015-04-21 ENCOUNTER — Other Ambulatory Visit: Payer: Self-pay | Admitting: Endocrinology

## 2015-04-25 ENCOUNTER — Other Ambulatory Visit: Payer: Self-pay

## 2015-04-25 DIAGNOSIS — Z1231 Encounter for screening mammogram for malignant neoplasm of breast: Secondary | ICD-10-CM

## 2015-04-25 DIAGNOSIS — Z9889 Other specified postprocedural states: Secondary | ICD-10-CM

## 2015-05-01 IMAGING — RF DG MYELOGRAPHY LUMBAR INJ MULTI REGION
12 of 24 series · 12 of 24 positions shown · non-contrast
Comparison: none

CLINICAL DATA: Low back pain. Mid back pain. Some radiation to the
LEFT leg.
TECHNIQUE: Contiguous axial images were obtained through the Thoracic and
Lumbar spine after the intrathecal infusion of infusion. Coronal and
sagittal reconstructions were obtained of the axial image sets.

[Series 2: (hospital) · 1 of 1 slices shown]
[im 1/1]
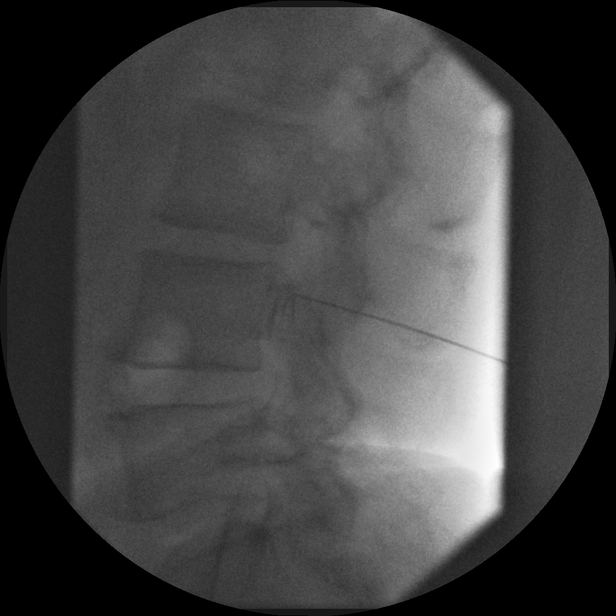

[Series 4: myelogram  white · 1 of 1 slices shown (1 of 10)]
[im 1/1]
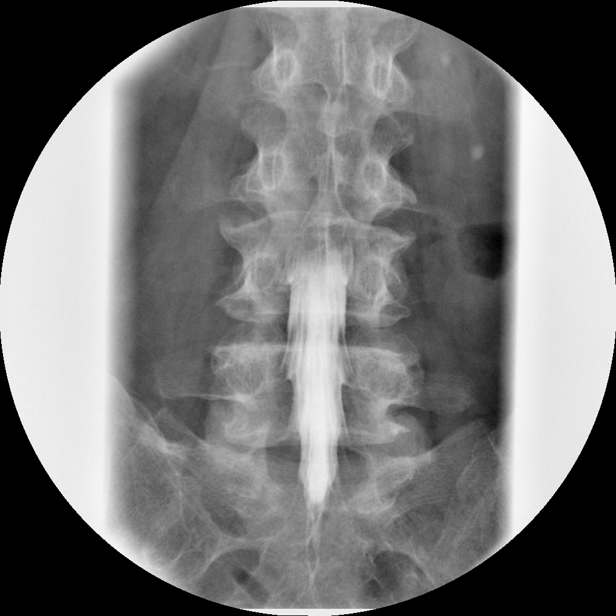

[Series 6: myelogram  white · 1 of 1 slices shown (2 of 10)]
[im 1/1]
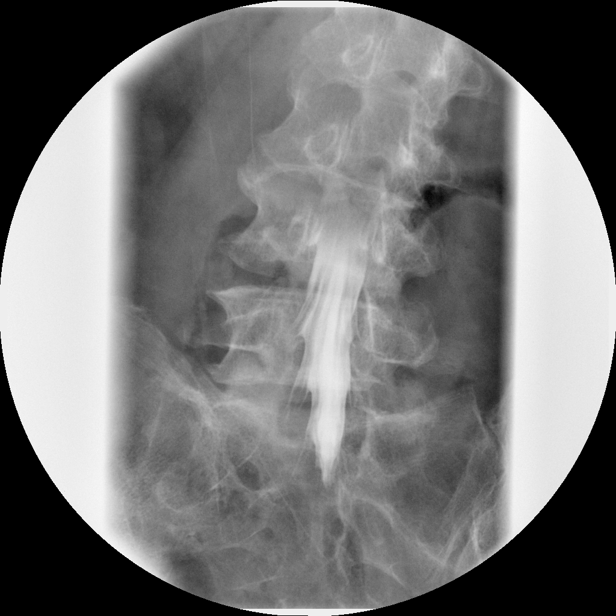

[Series 8: myelogram  white · 1 of 1 slices shown (3 of 10)]
[im 1/1]
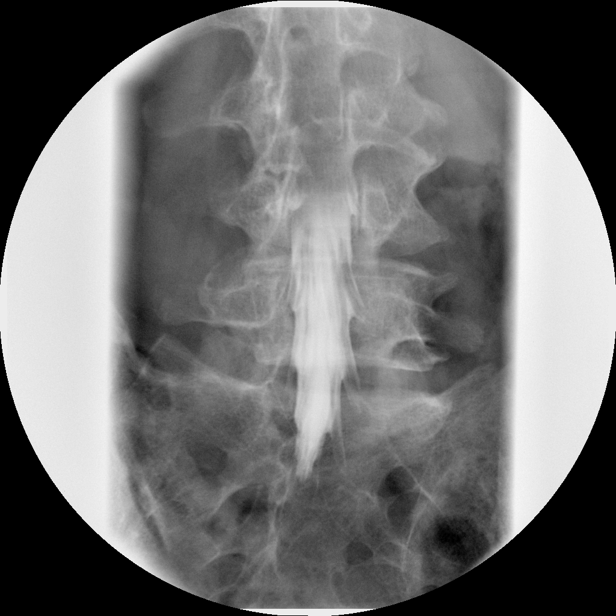

[Series 10: myelogram  white · 1 of 1 slices shown (4 of 10)]
[im 1/1]
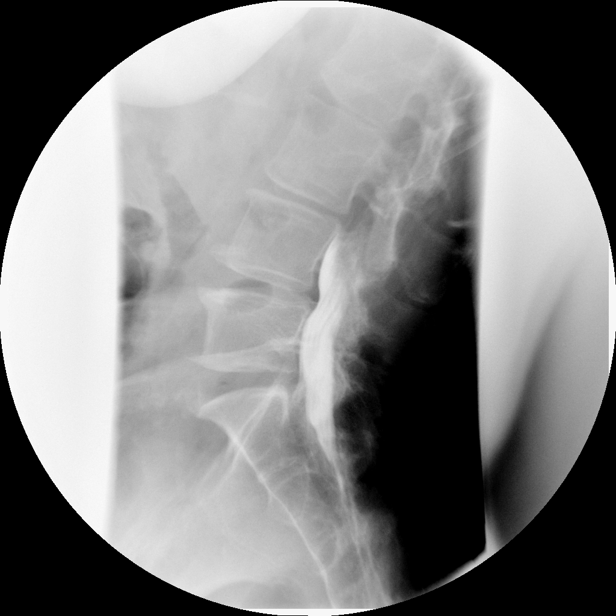

[Series 12: myelogram  white · 1 of 1 slices shown (5 of 10)]
[im 1/1]
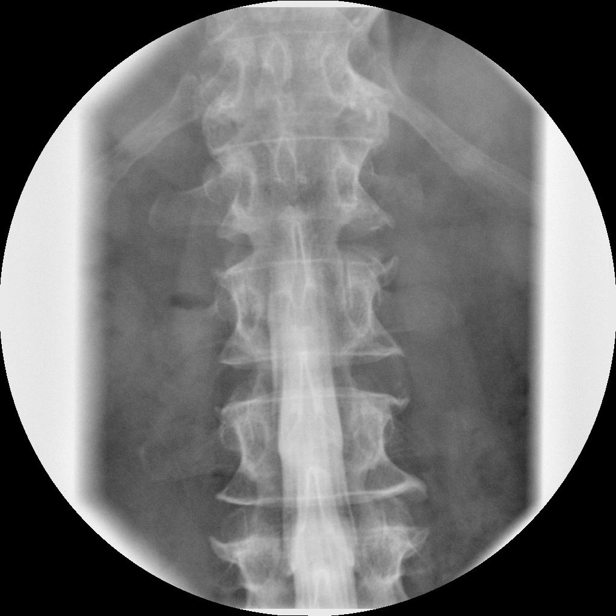

[Series 14: myelogram  white · 1 of 1 slices shown (6 of 10)]
[im 1/1]
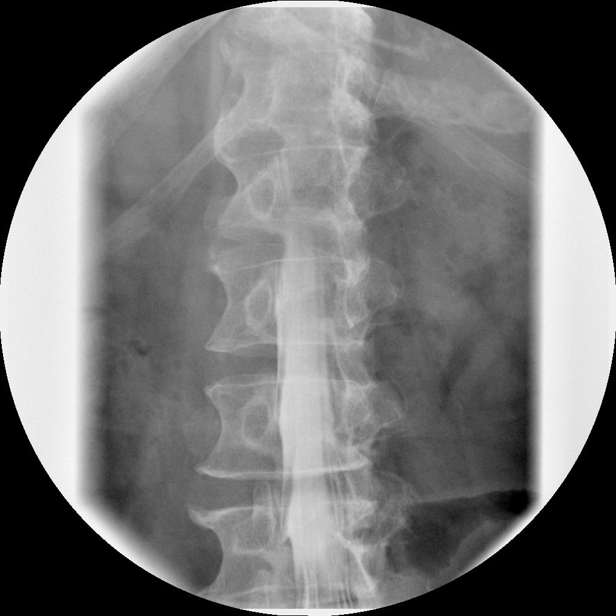

[Series 16: myelogram  white · 1 of 1 slices shown (7 of 10)]
[im 1/1]
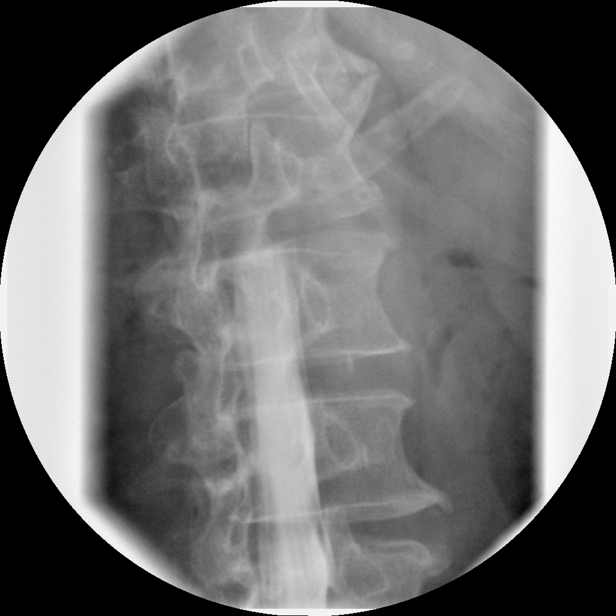

[Series 18: myelogram  white · 1 of 1 slices shown (8 of 10)]
[im 1/1]
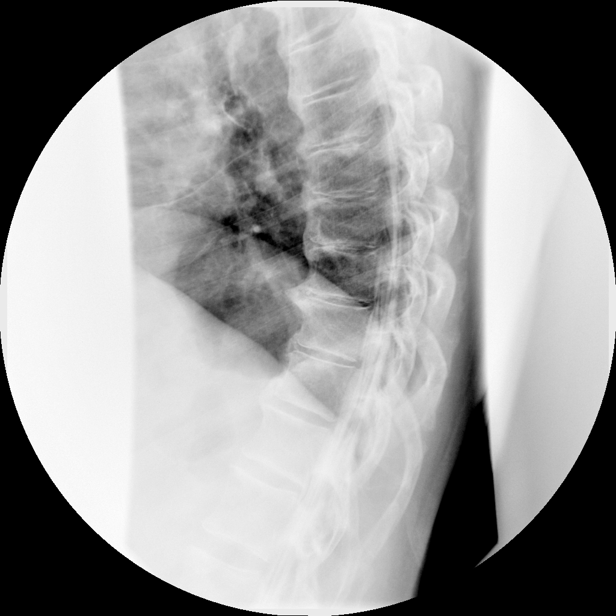

[Series 20: myelogram  white · 1 of 1 slices shown (9 of 10)]
[im 1/1]
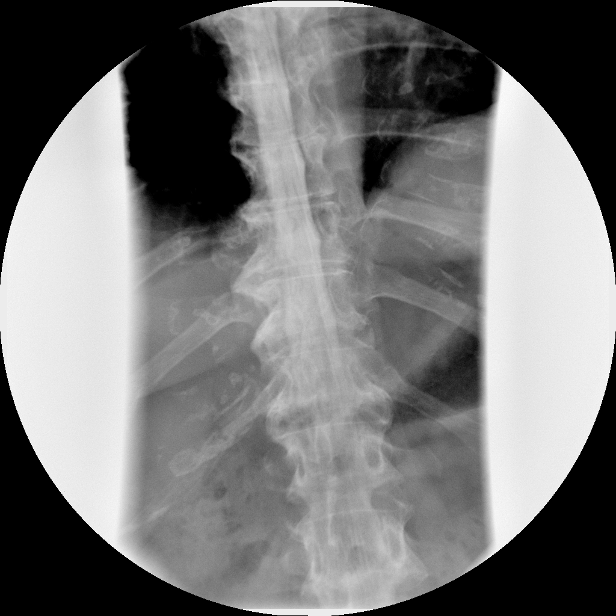

[Series 22: myelogram  white · 1 of 1 slices shown (10 of 10)]
[im 1/1]
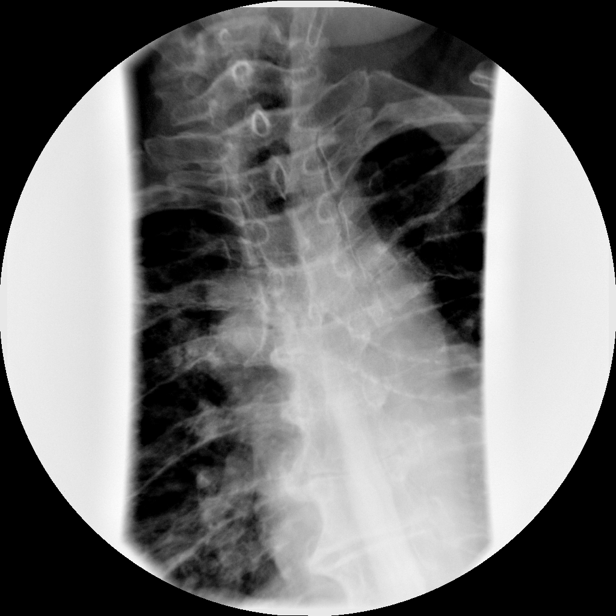

[Series 1001: view not recorded · 0.20mm/px · 1 of 1 slices shown]
[im 1/1]
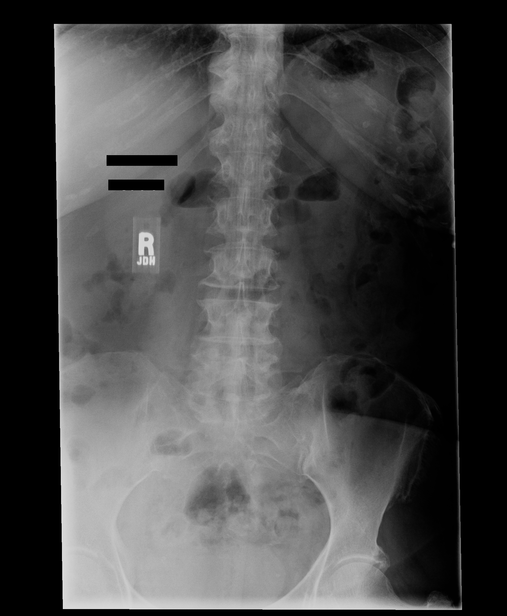

[12 of 24 positions shown; findings below may reference images not displayed]

FLUOROSCOPY TIME:  1 min and 58 seconds.

PROCEDURE:
LUMBAR PUNCTURE FOR THORACIC AND LUMBAR MYELOGRAM

After thorough discussion of risks and benefits of the procedure
including bleeding, infection, injury to nerves, blood vessels,
adjacent structures as well as headache and CSF leak, written and
oral informed consent was obtained. Consent was obtained by Dr. Ruble
Shiri.

Patient was positioned prone on the fluoroscopy table. Local
anesthesia was provided with 1% lidocaine without epinephrine after
prepped and draped in the usual sterile fashion. Puncture was
performed at L4 using a 3 1/2 inch 22-gauge spinal needle via
midline approach. Using a single pass through the dura, the needle
was placed within the thecal sac, with return of clear CSF. 10 mL of
5mnipaque-2VV was injected into the thecal sac, with normal
opacification of the nerve roots and cauda equina consistent with
free flow within the subarachnoid space. The patient was then moved
to the trendelenburg position and contrast flowed into the Thoracic
spine region.

I personally performed the lumbar puncture and administered the
intrathecal contrast. I also personally supervised acquisition of
the myelogram images.
FINDINGS: THORACIC AND LUMBAR MYELOGRAM FINDINGS:

LUMBAR: Good opacification of the lumbar subarachnoid space.
Alignment is anatomic. Intervertebral disc spaces are preserved.
Shallow ventral defect L4-5. No L5 nerve root cut off. Moderate
effacement of the LEFT S1 nerve root, with slight premature
truncation. Standing flexion extension radiographs demonstrate no
dynamic instability.

THORACIC: Good opacification of the thoracic subarachnoid space.
Shallow ventral defect T12-L1. No compression fracture or
subluxation. Moderate osteophytic spurring extends to the RIGHT of
the greater than LEFT. No cord enlargement.

CT THORACIC MYELOGRAM FINDINGS:

No visible pulmonary are mediastinal masses. Unremarkable aorta. No
worrisome osseous lesion. No compression deformity.

Cord is of normal size and signal throughout. There are no
intraspinal masses.

The individual disc spaces are examined as follows:

T1-2: Tiny focus of ossification posterior longitudinal ligament
above the disc space. No impingement.

T2-3:  Negative.

T3-4: No disc protrusion. Minor foraminal spurring on the RIGHT
without impingement.

T4-5: Minor foraminal spurring on the RIGHT without impingement. No
disc protrusion.

T5-6:  Normal.

T6-7: Central and leftward disc osteophyte complex narrows the
foramen. Slight effacement anterior subarachnoid space. No definite
impingement.

T7-8:  Unremarkable.

T8-9: Leftward disc osteophyte complex extends into the foramen. No
impingement.

T9-10: Leftward disc osteophyte complex extends toward the foramen.
No impingement.

T10-11:  Mild bulge.

T11-12:  Mild bulge.

CT LUMBAR MYELOGRAM FINDINGS:

No prevertebral or paraspinous masses. No worrisome osseous lesions.
Normal conus. Small lipoma of the filum terminale at the L1-L2 disc
space without conus tethering.

T12-L1:  Calcified central protrusion is noncompressive.

L1-L2:  Normal.

L2-L3: Normal disc space. Mild facet arthropathy. extraforaminal
spurring to the RIGHT not clearly compressive.

L3-L4: Mild bulge. Mild facet arthropathy. BILATERAL lateral
spurring not clearly compressive.

L4-L5: Shallow central protrusion. Mild facet arthropathy. No
subarticular zone or foraminal zone impingement.

L5-S1: Central and leftward protrusion. LEFT S1 nerve root
compression in the subarticular zone. Far lateral disc material on
the LEFT in association with facet arthropathy compresses the LEFT
L5 nerve root in the foraminal zone.

Compared with prior MR from 12/26/2009, a similar appearance is
noted.
IMPRESSION: No significant thoracic disc pathology. No cord compression or
intraspinal mass lesion.

Small lipoma of the filum terminale without tethered cord.

Central and leftward protrusion L5-S1.

No dynamic instability or malalignment.

## 2015-05-08 ENCOUNTER — Ambulatory Visit
Admission: RE | Admit: 2015-05-08 | Discharge: 2015-05-08 | Disposition: A | Discharge status: Home / Self Care | Payer: Medicare Other | Source: Ambulatory Visit

## 2015-05-08 ENCOUNTER — Other Ambulatory Visit (INDEPENDENT_AMBULATORY_CARE_PROVIDER_SITE_OTHER): Payer: Medicare Other

## 2015-05-08 DIAGNOSIS — Z9889 Other specified postprocedural states: Secondary | ICD-10-CM

## 2015-05-08 DIAGNOSIS — Z1231 Encounter for screening mammogram for malignant neoplasm of breast: Secondary | ICD-10-CM

## 2015-05-08 DIAGNOSIS — E119 Type 2 diabetes mellitus without complications: Secondary | ICD-10-CM

## 2015-05-08 DIAGNOSIS — E038 Other specified hypothyroidism: Secondary | ICD-10-CM | POA: Diagnosis not present

## 2015-05-08 DIAGNOSIS — E063 Autoimmune thyroiditis: Secondary | ICD-10-CM

## 2015-05-08 LAB — COMPREHENSIVE METABOLIC PANEL
ALK PHOS: 45 U/L (ref 39–117)
ALT: 33 U/L (ref 0–35)
AST: 22 U/L (ref 0–37)
Albumin: 4.6 g/dL (ref 3.5–5.2)
BILIRUBIN TOTAL: 0.5 mg/dL (ref 0.2–1.2)
BUN: 9 mg/dL (ref 6–23)
CO2: 30 meq/L (ref 19–32)
CREATININE: 0.68 mg/dL (ref 0.40–1.20)
Calcium: 9.7 mg/dL (ref 8.4–10.5)
Chloride: 104 mEq/L (ref 96–112)
GFR: 92.22 mL/min (ref >60.00)
GLUCOSE: 95 mg/dL (ref 70–99)
Potassium: 3.9 mEq/L (ref 3.5–5.1)
Sodium: 140 mEq/L (ref 135–145)
TOTAL PROTEIN: 6.7 g/dL (ref 6.0–8.3)

## 2015-05-08 LAB — HEMOGLOBIN A1C: HEMOGLOBIN A1C: 7.1 % — AB (ref 4.6–6.5)

## 2015-05-08 LAB — TSH: TSH: 2.08 u[IU]/mL (ref 0.35–4.50)

## 2015-05-08 LAB — MICROALBUMIN / CREATININE URINE RATIO
Creatinine,U: 205.1 mg/dL
MICROALB/CREAT RATIO: 0.4 mg/g (ref 0.0–30.0)
Microalb, Ur: 0.9 mg/dL (ref 0.0–1.9)

## 2015-05-13 ENCOUNTER — Ambulatory Visit (INDEPENDENT_AMBULATORY_CARE_PROVIDER_SITE_OTHER): Payer: Medicare Other | Admitting: Endocrinology

## 2015-05-13 ENCOUNTER — Encounter: Payer: Self-pay | Admitting: Endocrinology

## 2015-05-13 VITALS — BP 128/82 | HR 62 | Temp 98.2°F | Resp 14 | Ht 59.0 in | Wt 142.2 lb

## 2015-05-13 DIAGNOSIS — E119 Type 2 diabetes mellitus without complications: Secondary | ICD-10-CM | POA: Diagnosis not present

## 2015-05-13 NOTE — Progress Notes (Signed)
Patient ID: Jennifer Baker, female   DOB: 01-May-1950, 65 y.o.   MRN: [id number]   Reason for Appointment: Diabetes follow-up   History of Present Illness   Diagnosis: Type 2 DIABETES MELITUS, date of diagnosis:1997    She has been on basal insulin after a trial of metformin and Byetta and has been on Lantus for several years She had done better with adding Victoza in 05/2010 with weight loss, reduced insulin dose and better control Because of occasional noncompliance with Victoza she was switched to Trulicity in [id number] With this she has had no side effects of nausea and is able to take it every Sunday  She was able to cut back on her portions with starting Trulicity and reduce Lantus dosage  On this visit her A1c is relatively higher at 7.1, previously lower  .   Current blood sugar patterns and problems identified:   She has checked her blood sugar only occasionally and mostly fasting or lunchtime and forgets to do readings after meals as directed  She has not been consistent with her diet over the last couple of months and this is likely causing postprandial hyperglycemia  She is still not motivated to exercise  She did cut back 2 units on her Lantus since her last visit and fasting readings are mostly just over 100 now     Oral hypoglycemic drug: Metformin ER 1500 mg        Side effects from medications: None Insulin regimen: Lantus 10 units hs          Proper timing of medications in relation to meals: Yes.          Monitors blood glucose:  irregularly.    Glucometer: One Touch.          Blood Glucose readings from meter download show   Mean values apply above for all meters except median for One Touch  PRE-MEAL Fasting Lunch Dinner Bedtime Overall  Glucose range: 90-110  89-115      Mean/median:      10 2     Hypoglycemia frequency:  none recently.           Meals: 3 meals per day. Watching her portions at meals usually Physical activity: exercise:   none Dietician visit: Most recent: 2012, also with husband in [id number]         Complications: are: None     Wt Readings from Last 3 Encounters:  05/13/15 142 lb 3.2 oz (64.501 kg)  01/08/15 139 lb 12.8 oz (63.413 kg)  09/03/14 145 lb 6.4 oz (65.953 kg)   LABS:  Lab Results  Component Value Date   HGBA1C 7.1* 05/08/2015   HGBA1C 6.6* 12/31/2014   HGBA1C 6.5 08/29/2014   Lab Results  Component Value Date   MICROALBUR 0.9 05/08/2015   LDLCALC 65 12/31/2014   CREATININE 0.68 05/08/2015      Medication List       This list is accurate as of: 05/13/15 10:02 AM.  Always use your most recent med list.               B-COMPLEX PO  Take by mouth.     calcium carbonate 600 MG Tabs tablet  Commonly known as:  OS-CAL  Take 600 mg by mouth 2 (two) times daily with a meal.     [facility] PRENATAL GUMMY 0.4-113.5 MG Chew  Chew by mouth.     Dulaglutide 1.5 MG/0.5ML Sopn  Commonly known as:  TRULICITY  Inject once a week     glucose blood test strip  Commonly known as:  ONE TOUCH ULTRA TEST  Use to check blood sugars three times per day dx code 250.02     Insulin Pen Needle 32G X 5 MM Misc  Commonly known as:  NOVOTWIST  Use 2 times daily. Dx code: E11.65     LANTUS SOLOSTAR 100 UNIT/ML Solostar Pen  Generic drug:  Insulin Glargine  INJECT 10 UNITS SUBCUTANEOUSLY DAILY     levothyroxine 50 MCG tablet  Commonly known as:  SYNTHROID, LEVOTHROID  Take 50 mcg by mouth daily before breakfast.     metFORMIN 500 MG 24 hr tablet  Commonly known as:  GLUCOPHAGE-XR  TAKE 3 TABLETS EVERY MORNING     ONETOUCH DELICA LANCETS FINE Misc  1 each by Does not apply route 2 (two) times daily.     PREMARIN vaginal cream  Generic drug:  conjugated estrogens     RESTASIS 0.05 % ophthalmic emulsion  Generic drug:  cycloSPORINE  Place 1 drop into both eyes every 12 (twelve) hours.     sertraline 50 MG tablet  Commonly known as:  ZOLOFT  Take 50 mg by mouth daily.     simvastatin 40 MG  tablet  Commonly known as:  ZOCOR  Take 1 tablet (40 mg total) by mouth daily at 6 PM.     triamcinolone cream 0.1 %  Commonly known as:  KENALOG        Allergies: No Known Allergies  No past medical history on file.  Past Surgical History  Procedure Laterality Date  . Abdominal hysterectomy      Family History  Problem Relation Age of Onset  . Diabetes Mother   . Heart disease Brother   . Early death Brother     Social History:  reports that she has never smoked. She has never used smokeless tobacco. Her alcohol and drug histories are not on file.  Review of Systems:     HYPERLIPIDEMIA: The lipid abnormality consists of elevated LDL, on simvastatin 40 mg for last few years with good control  Lab Results  Component Value Date   CHOL 117 12/31/2014   HDL 38.10* 12/31/2014   LDLCALC 65 12/31/2014   LDLDIRECT 92.3 01/24/2014   TRIG 70.0 12/31/2014   CHOLHDL 3 12/31/2014   History of mild hypothyroidism, taking levothyroxine 50 mcg with consistent levels  Lab Results  Component Value Date   TSH 2.08 05/08/2015     Diabetic foot exam in 5/16 shows normal monofilament sensation in the toes and plantar surface except on the left fifth toe distally, no skin lesions or ulcers on the feet and normal pedal pulses    Examination:   BP 128/82 mmHg  Pulse 62  Temp(Src) 98.2 F (36.8 C)  Resp 14  Ht 4\' 11"  (1.499 m)  Wt 142 lb 3.2 oz (64.501 kg)  BMI 28.71 kg/m2  SpO2 97%  Body mass index is 28.71 kg/(m^2).   No pedal edema   ASSESSMENT/ PLAN:   Diabetes type 2   The patient's diabetes control is not as good with A1c going up from previous upper normal range to 7.1 Most likely has postprandial hyperglycemia when she does not watch her diet; she has not monitored postprandial readings on this visit Also she did not take Trulicity for a couple of weeks  Currently fasting readings are fairly close to normal  Plan:  She will check more readings at  bedtime  Stay consistent with Trulicity Start walking or water exercises No change in Lantus unless morning sugars are low normal again  HYPOTHYROIDISM: Appears to be mild and requiring only 50 g supplement, TSH normal    Jennifer Baker 05/13/2015, 10:02 AM

## 2015-05-13 NOTE — Patient Instructions (Addendum)
Start walking or swimming  Check some sugars at bedtime  Same insulin

## 2015-05-20 ENCOUNTER — Ambulatory Visit (AMBULATORY_SURGERY_CENTER): Payer: Self-pay | Admitting: *Deleted

## 2015-05-20 VITALS — Ht 59.0 in | Wt 142.0 lb

## 2015-05-20 DIAGNOSIS — Z86010 Personal history of colonic polyps: Secondary | ICD-10-CM

## 2015-05-20 MED ORDER — NA SULFATE-K SULFATE-MG SULF 17.5-3.13-1.6 GM/177ML PO SOLN: ORAL | Status: DC

## 2015-05-20 NOTE — Progress Notes (Signed)
No allergies to eggs or soy. No problems with anesthesia.  Pt given Emmi instructions for colonoscopy  No oxygen use  No diet drug use  

## 2015-06-03 ENCOUNTER — Ambulatory Visit (AMBULATORY_SURGERY_CENTER): Payer: Medicare Other | Admitting: Gastroenterology

## 2015-06-03 ENCOUNTER — Encounter: Payer: Self-pay | Admitting: Gastroenterology

## 2015-06-03 VITALS — BP 136/57 | HR 70 | Temp 95.9°F | Resp 15 | Ht 59.0 in | Wt 142.0 lb

## 2015-06-03 DIAGNOSIS — D120 Benign neoplasm of cecum: Secondary | ICD-10-CM

## 2015-06-03 DIAGNOSIS — Z86010 Personal history of colonic polyps: Secondary | ICD-10-CM | POA: Diagnosis present

## 2015-06-03 MED ORDER — SODIUM CHLORIDE 0.9 % IV SOLN: 500.0000 mL | INTRAVENOUS | Status: DC

## 2015-06-03 MED ORDER — DEXTROSE 5 % IV SOLN: INTRAVENOUS | Status: DC

## 2015-06-03 NOTE — Progress Notes (Signed)
Report to PACU, RN, vss, BBS= Clear.  

## 2015-06-03 NOTE — Progress Notes (Signed)
Called to room to assist during endoscopic procedure.  Patient ID and intended procedure confirmed with present staff. Received instructions for my participation in the procedure from the performing physician.  

## 2015-06-03 NOTE — Op Note (Signed)
[place]  [address] [place], [id number]   COLONOSCOPY PROCEDURE REPORT  PATIENT: Jennifer Baker, Jennifer Baker  MR#: [id number] BIRTHDATE: 02/23/1951 , 1  yrs. old GENDER: female ENDOSCOPIST: Milus Banister, MD PROCEDURE DATE:  06/03/2015 PROCEDURE:   Colonoscopy, surveillance and Colonoscopy with snare polypectomy First Screening Colonoscopy - Avg.  risk and is 50 yrs.  old or older - No.  Prior Negative Screening - Now for repeat screening. N/A  History of Adenoma - Now for follow-up colonoscopy & has been > or = to 3 yrs.  Yes hx of adenoma.  Has been 3 or more years since last colonoscopy.  Polyps removed today? Yes ASA CLASS:   Class II INDICATIONS:Colonoscopy 2011 Dr.  Ardis Hughs, single small TA removed.  MEDICATIONS: Monitored anesthesia care and Propofol 200 mg IV  DESCRIPTION OF PROCEDURE:   After the risks benefits and alternatives of the procedure were thoroughly explained, informed consent was obtained.  The digital rectal exam revealed no abnormalities of the rectum.   The LB [id number]  endoscope was introduced through the anus and advanced to the cecum, which was identified by both the appendix and ileocecal valve. No adverse events experienced.   The quality of the prep was excellent.  The instrument was then slowly withdrawn as the colon was fully examined. Estimated blood loss is zero unless otherwise noted in this procedure report.   COLON FINDINGS: A sessile polyp measuring 3 mm in size was found at the cecum.  A polypectomy was performed with a cold snare.  The resection was complete, the polyp tissue was completely retrieved and sent to histology.   There was mild diverticulosis noted in the left colon.   The examination was otherwise normal.  Retroflexed views revealed no abnormalities. The time to cecum = 6.7 Withdrawal time = 6.0   The scope was withdrawn and the procedure completed. COMPLICATIONS: There were no immediate  complications.  ENDOSCOPIC IMPRESSION: 1.   Sessile polyp was found at the cecum; polypectomy was performed with a cold snare 2.   Mild diverticulosis was noted in the left colon 3.   The examination was otherwise normal  RECOMMENDATIONS: If the polyp(s) removed today are proven to be adenomatous (pre-cancerous) polyps, you will need a repeat colonoscopy in 5 years.  Otherwise you should continue to follow colorectal cancer screening guidelines for "routine risk" patients with colonoscopy in 10 years.  You will receive a letter within 1-2 weeks with the results of your biopsy as well as final recommendations.  Please call my office if you have not received a letter after 3 weeks.  eSigned:  Milus Banister, MD 06/03/2015 11:42 AM

## 2015-06-03 NOTE — Patient Instructions (Signed)
YOU HAD AN ENDOSCOPIC PROCEDURE TODAY AT [facility]:   Refer to the procedure report that was given to you for any specific questions about what was found during the examination.  If the procedure report does not answer your questions, please call your gastroenterologist to clarify.  If you requested that your care partner not be given the details of your procedure findings, then the procedure report has been included in a sealed envelope for you to review at your convenience later.  YOU SHOULD EXPECT: Some feelings of bloating in the abdomen. Passage of more gas than usual.  Walking can help get rid of the air that was put into your GI tract during the procedure and reduce the bloating. If you had a lower endoscopy (such as a colonoscopy or flexible sigmoidoscopy) you may notice spotting of blood in your stool or on the toilet paper. If you underwent a bowel prep for your procedure, you may not have a normal bowel movement for a few days.  Please Note:  You might notice some irritation and congestion in your nose or some drainage.  This is from the oxygen used during your procedure.  There is no need for concern and it should clear up in a day or so.  SYMPTOMS TO REPORT IMMEDIATELY:   Following lower endoscopy (colonoscopy or flexible sigmoidoscopy):  Excessive amounts of blood in the stool  Significant tenderness or worsening of abdominal pains  Swelling of the abdomen that is new, acute  Fever of 100F or higher  For urgent or emergent issues, a gastroenterologist can be reached at any hour by calling [phone].   DIET: Your first meal following the procedure should be a small meal and then it is ok to progress to your normal diet. Heavy or fried foods are harder to digest and may make you feel nauseous or bloated.  Likewise, meals heavy in dairy and vegetables can increase bloating.  Drink plenty of fluids but you should avoid alcoholic beverages for 24  hours.  ACTIVITY:  You should plan to take it easy for the rest of today and you should NOT DRIVE or use heavy machinery until tomorrow (because of the sedation medicines used during the test).    FOLLOW UP: Our staff will call the number listed on your records the next business day following your procedure to check on you and address any questions or concerns that you may have regarding the information given to you following your procedure. If we do not reach you, we will leave a message.  However, if you are feeling well and you are not experiencing any problems, there is no need to return our call.  We will assume that you have returned to your regular daily activities without incident.  If any biopsies were taken you will be contacted by phone or by letter within the next 1-3 weeks.  Please call us at [phone] if you have not heard about the biopsies in 3 weeks.   SIGNATURES/CONFIDENTIALITY: You and/or your care partner have signed paperwork which will be entered into your electronic medical record.  These signatures attest to the fact that that the information above on your After Visit Summary has been reviewed and is understood.  Full responsibility of the confidentiality of this discharge information lies with you and/or your care-partner.  Please read over handout about polyps, diverticulosis and high fiber diets  Continue your normal medications

## 2015-06-04 ENCOUNTER — Telehealth: Payer: Self-pay

## 2015-06-04 NOTE — Telephone Encounter (Signed)
  Follow up Call-  Call back number 06/03/2015 02/12/2014  Post procedure Call Back phone  # [phone] cell [phone]  Permission to leave phone message Yes -     Patient questions:  Do you have a fever, pain , or abdominal swelling? No. Pain Score  0 *  Have you tolerated food without any problems? Yes.    Have you been able to return to your normal activities? Yes.    Do you have any questions about your discharge instructions: Diet   No. Medications  No. Follow up visit  No.  Do you have questions or concerns about your Care? No.  Actions: * If pain score is 4 or above: No action needed, pain <4.

## 2015-06-10 ENCOUNTER — Encounter: Payer: Self-pay | Admitting: Gastroenterology

## 2015-06-27 ENCOUNTER — Other Ambulatory Visit: Payer: Self-pay | Admitting: Endocrinology

## 2015-08-07 ENCOUNTER — Other Ambulatory Visit (INDEPENDENT_AMBULATORY_CARE_PROVIDER_SITE_OTHER): Payer: Medicare Other

## 2015-08-07 DIAGNOSIS — E119 Type 2 diabetes mellitus without complications: Secondary | ICD-10-CM | POA: Diagnosis not present

## 2015-08-07 LAB — BASIC METABOLIC PANEL
BUN: 11 mg/dL (ref 6–23)
CHLORIDE: 104 meq/L (ref 96–112)
CO2: 29 meq/L (ref 19–32)
Calcium: 9.7 mg/dL (ref 8.4–10.5)
Creatinine, Ser: 0.63 mg/dL (ref 0.40–1.20)
GFR: 100.64 mL/min (ref >60.00)
GLUCOSE: 91 mg/dL (ref 70–99)
POTASSIUM: 3.9 meq/L (ref 3.5–5.1)
SODIUM: 139 meq/L (ref 135–145)

## 2015-08-07 LAB — HEMOGLOBIN A1C: Hgb A1c MFr Bld: 6.2 % (ref 4.6–6.5)

## 2015-08-12 ENCOUNTER — Ambulatory Visit (INDEPENDENT_AMBULATORY_CARE_PROVIDER_SITE_OTHER): Payer: Medicare Other | Admitting: Endocrinology

## 2015-08-12 ENCOUNTER — Encounter: Payer: Self-pay | Admitting: Endocrinology

## 2015-08-12 VITALS — BP 126/82 | HR 65 | Temp 97.7°F | Resp 14 | Ht 59.0 in | Wt 140.6 lb

## 2015-08-12 DIAGNOSIS — E119 Type 2 diabetes mellitus without complications: Secondary | ICD-10-CM | POA: Diagnosis not present

## 2015-08-12 NOTE — Patient Instructions (Signed)
Check blood sugars on waking up 2-3  times a week Also check blood sugars about 2 hours after a meal and do this after different meals by rotation  Recommended blood sugar levels on waking up is 90-130 and about 2 hours after meal is 130-160  Please bring your blood sugar monitor to each visit, thank you  Lantus 10 units

## 2015-08-12 NOTE — Progress Notes (Signed)
Patient ID: Jennifer Baker, female   DOB: 1950-06-09, 65 y.o.   MRN: [id number]   Reason for Appointment: Diabetes follow-up   History of Present Illness   Diagnosis: Type 2 DIABETES MELITUS, date of diagnosis:1997    She has been on basal insulin after a trial of metformin and Byetta and has been on Lantus for several years She had done better with adding Victoza in 05/2010 with weight loss, reduced insulin dose and better control Because of occasional noncompliance with Victoza she was switched to Trulicity in [id number] With this she has had no side effects of nausea and is able to take it every Sunday  She was able to cut back on her portions with starting Trulicity and reduce Lantus dosage    Oral hypoglycemic drug: Metformin ER 1500 mg        Insulin regimen: Lantus 12 units hs   On her visit in 1/17 her A1c was relatively higher at 7.1, now back down to 6.2 which is better than usual .   Current blood sugar patterns and problems identified:   She has checked her blood sugar only occasionally and except for one reading has only fasting readings.  She thinks that she forgets to do the readings after meals or is having some snack or smaller meals frequently at various times.  She is not able to exercise but her weight is down 2 pounds  She generally compliant with her diet  Has been  more regular with her Trulicity, previously may have high readings from forgetting it for a couple of weeks at least   Side effects from diabetes medications: None              Monitors blood glucose:  irregularly.    Glucometer: One Touch.          Blood Glucose readings from meter download show   Mean values apply above for all meters except median for One Touch  PRE-MEAL Fasting Lunch 2 PM  Bedtime Overall  Glucose range: 89-106   89     Mean/median: 96     95      Hypoglycemia frequency:  none Reported .           Meals: 3 meals per day. Watching her portions at meals usually, less  meat and more chicken now  Physical activity: exercise:  none, limited by sciatica Dietician visit: Most recent: 2012, also with husband in [id number]         Complications: are: None     Wt Readings from Last 3 Encounters:  08/12/15 140 lb 9.6 oz (63.776 kg)  06/03/15 142 lb (64.411 kg)  05/20/15 142 lb (64.411 kg)   LABS:  Lab Results  Component Value Date   HGBA1C 6.2 08/07/2015   HGBA1C 7.1* 05/08/2015   HGBA1C 6.6* 12/31/2014   Lab Results  Component Value Date   MICROALBUR 0.9 05/08/2015   LDLCALC 65 12/31/2014   CREATININE 0.63 08/07/2015      Medication List       This list is accurate as of: 08/12/15 10:39 AM.  Always use your most recent med list.               calcium carbonate 600 MG Tabs tablet  Commonly known as:  OS-CAL  Take 600 mg by mouth 2 (two) times daily with a meal.     [facility] PRENATAL GUMMY 0.4-113.5 MG Chew  Chew by mouth.     glucose blood  test strip  Commonly known as:  ONE TOUCH ULTRA TEST  Use to check blood sugars three times per day dx code 250.02     Insulin Pen Needle 32G X 5 MM Misc  Commonly known as:  NOVOTWIST  Use 2 times daily. Dx code: E11.65     LANTUS SOLOSTAR 100 UNIT/ML Solostar Pen  Generic drug:  Insulin Glargine  INJECT 10 UNITS SUBCUTANEOUSLY DAILY     levothyroxine 50 MCG tablet  Commonly known as:  SYNTHROID, LEVOTHROID  Take 50 mcg by mouth daily before breakfast.     metFORMIN 500 MG 24 hr tablet  Commonly known as:  GLUCOPHAGE-XR  TAKE 3 TABLETS EVERY MORNING     ONETOUCH DELICA LANCETS FINE Misc  1 each by Does not apply route 2 (two) times daily.     PREMARIN vaginal cream  Generic drug:  conjugated estrogens     RESTASIS 0.05 % ophthalmic emulsion  Generic drug:  cycloSPORINE  Place 1 drop into both eyes every 12 (twelve) hours.     sertraline 50 MG tablet  Commonly known as:  ZOLOFT  Take 50 mg by mouth daily.     simvastatin 40 MG tablet  Commonly known as:  ZOCOR  Take 1 tablet (40 mg  total) by mouth daily at 6 PM.     triamcinolone cream 0.1 %  Commonly known as:  KENALOG  Reported on [id number]     TRULICITY 1.5 0000000 Sopn  Generic drug:  Dulaglutide  INJECT ONCE A WEEK        Allergies: No Known Allergies  Past Medical History  Diagnosis Date  . Diabetes mellitus ([place])   . Hyperlipidemia   . Hypothyroidism   . Arthritis   . Allergy   . Blood transfusion without reported diagnosis 1969    after ectopic pregnancy    Past Surgical History  Procedure Laterality Date  . Abdominal hysterectomy  2000  . Ectopic pregnancy surgery  1969, 1971  . Dilation and curettage of uterus  1970    miscarriage  . Septoplasty  1977  . Myomectomy  1987    lysis of adhesions  . Carpal tunnel release Bilateral 1990, 1989  . Acromioplasty Bilateral [id number]  . Lasik Left 1999  . Breast reduction surgery Bilateral   . Ulnar nerve transposition Left 2010  . Shoulder arthroscopy Left 2011  . Ankle surgery Left 2011    screw for peroneal tendonitis  . Shoulder arthroscopy Right 2013  . Elbow surgery Left 2014    tendonitis    Family History  Problem Relation Age of Onset  . Diabetes Mother   . Heart disease Brother   . Early death Brother   . Colon cancer Neg Hx     Social History:  reports that she has never smoked. She has never used smokeless tobacco. She reports that she does not drink alcohol or use illicit drugs.  Review of Systems:    HYPERLIPIDEMIA: The lipid abnormality consists of elevated LDL, on simvastatin 40 mg for last few years with good control  Lab Results  Component Value Date   CHOL 117 12/31/2014   HDL 38.10* 12/31/2014   LDLCALC 65 12/31/2014   LDLDIRECT 92.3 01/24/2014   TRIG 70.0 12/31/2014   CHOLHDL 3 12/31/2014   History of mild hypothyroidism, taking levothyroxine 50 mcg with consistentTSH levels  Lab Results  Component Value Date   TSH 2.08 05/08/2015     Diabetic foot exam in 5/16 shows normal  monofilament sensation  in the toes and plantar surface except on the left fifth toe distally, no skin lesions or ulcers on the feet and normal pedal pulses    Examination:   BP 126/82 mmHg  Pulse 65  Temp(Src) 97.7 F (36.5 C)  Resp 14  Ht 4\' 11"  (1.499 m)  Wt 140 lb 9.6 oz (63.776 kg)  BMI 28.38 kg/m2  SpO2 97%  Body mass index is 28.38 kg/(m^2).     ASSESSMENT/ PLAN:   Diabetes type 2 with BMI 28  The patient's diabetes control is significantly better with A1c 6.2 now His likely to be from better compliance with her diet and also Trulicity compared to her last visit when A1c was 7.1 See discussion above She is however checking blood sugars only fasting and not after meals as directed  Currently fasting readings are fairly close to normal without overnight hypoglycemia and averaging only 97  Plan:  She will check more readings at bedtime Reduce Lantus to 10 units      Kori Colin 08/12/2015, 10:39 AM

## 2015-08-29 ENCOUNTER — Other Ambulatory Visit: Payer: Self-pay | Admitting: Endocrinology

## 2015-08-29 MED ORDER — GLUCOSE BLOOD VI STRP: ORAL_STRIP | Status: AC

## 2015-12-09 ENCOUNTER — Other Ambulatory Visit (INDEPENDENT_AMBULATORY_CARE_PROVIDER_SITE_OTHER): Payer: Medicare Other

## 2015-12-09 DIAGNOSIS — E119 Type 2 diabetes mellitus without complications: Secondary | ICD-10-CM | POA: Diagnosis not present

## 2015-12-09 LAB — BASIC METABOLIC PANEL
BUN: 12 mg/dL (ref 6–23)
CALCIUM: 9.9 mg/dL (ref 8.4–10.5)
CHLORIDE: 104 meq/L (ref 96–112)
CO2: 28 meq/L (ref 19–32)
Creatinine, Ser: 0.68 mg/dL (ref 0.40–1.20)
GFR: 92.05 mL/min (ref >60.00)
GLUCOSE: 114 mg/dL — AB (ref 70–99)
Potassium: 4 mEq/L (ref 3.5–5.1)
SODIUM: 141 meq/L (ref 135–145)

## 2015-12-09 LAB — HEMOGLOBIN A1C: Hgb A1c MFr Bld: 6.6 % — ABNORMAL HIGH (ref 4.6–6.5)

## 2015-12-11 ENCOUNTER — Other Ambulatory Visit: Payer: Self-pay | Admitting: Endocrinology

## 2015-12-12 ENCOUNTER — Other Ambulatory Visit: Payer: Self-pay

## 2015-12-12 ENCOUNTER — Encounter: Payer: Self-pay | Admitting: Endocrinology

## 2015-12-12 ENCOUNTER — Ambulatory Visit: Payer: Medicare Other | Admitting: Endocrinology

## 2015-12-12 ENCOUNTER — Ambulatory Visit (INDEPENDENT_AMBULATORY_CARE_PROVIDER_SITE_OTHER): Payer: Medicare Other | Admitting: Endocrinology

## 2015-12-12 VITALS — BP 100/58 | HR 74 | Ht 59.0 in | Wt 144.0 lb

## 2015-12-12 DIAGNOSIS — E119 Type 2 diabetes mellitus without complications: Secondary | ICD-10-CM | POA: Diagnosis not present

## 2015-12-12 NOTE — Progress Notes (Addendum)
Patient ID: Jennifer Baker, female   DOB: 11/23/1950, 65 y.o.   MRN: [id number]   Reason for Appointment: Diabetes follow-up   History of Present Illness   Diagnosis: Type 2 DIABETES MELITUS, date of diagnosis:1997    She has been on basal insulin after a trial of metformin and Byetta and has been on Lantus for several years She had done better with adding Victoza in 05/2010 with weight loss, reduced insulin dose and better control Because of occasional noncompliance with Victoza she was switched to Trulicity in [id number] With this she has had no side effects of nausea and is able to take it every Sunday  She was able to cut back on her portions with starting Trulicity and reduce Lantus dosage    Oral hypoglycemic drug: Metformin ER 1500 mg        Insulin regimen: Lantus 12 units hs   .  Her A1c is slightly higher at 6.6 compared to 6.2 although highest has been 7.1    Current blood sugar patterns and problems identified:   She has checked her blood sugar only occasionally and only 3 readings in the mornings in the last month  Has only one reading at suppertime which was fairly good  She is not able to exercise and her weight has gone up 4 pounds  She is variably compliant with her diet although still cutting back on meets   Side effects from diabetes medications: None              Monitors blood glucose:  irregularly.    Glucometer: One Touch.          Blood Glucose readings from meter download show range 108-120, her test strips are not expired   Hypoglycemia frequency:  none  .           Meals: 3 meals per day. Watching her portions at meals usually, less meat and more chicken but sometimes more carbohydrates  Physical activity: exercise:  none, limited by sciatica Dietician visit: Most recent: 2012, also with husband in [id number]         Complications: are: None     Wt Readings from Last 3 Encounters:  12/12/15 144 lb (65.3 kg)  08/12/15 140 lb 9.6 oz (63.8 kg)    06/03/15 142 lb (64.4 kg)   LABS:  Lab Results  Component Value Date   HGBA1C 6.6 (H) 12/09/2015   HGBA1C 6.2 08/07/2015   HGBA1C 7.1 (H) 05/08/2015   Lab Results  Component Value Date   MICROALBUR 0.9 05/08/2015   LDLCALC 65 12/31/2014   CREATININE 0.68 12/09/2015      Medication List       Accurate as of 12/12/15  9:42 AM. Always use your most recent med list.          calcium carbonate 600 MG Tabs tablet Commonly known as:  OS-CAL Take 600 mg by mouth 2 (two) times daily with a meal.   [facility]S PRENATAL GUMMY 0.4-113.5 MG Chew Chew by mouth.   glucose blood test strip Commonly known as:  ONE TOUCH ULTRA TEST Use to check blood sugars three times per day dx code E11.9   Insulin Pen Needle 32G X 5 MM Misc Commonly known as:  NOVOTWIST Use 2 times daily. Dx code: E11.65   LANTUS SOLOSTAR 100 UNIT/ML Solostar Pen Generic drug:  Insulin Glargine INJECT 10 UNITS SUBCUTANEOUSLY DAILY   levothyroxine 50 MCG tablet Commonly known as:  SYNTHROID, LEVOTHROID Take 50  mcg by mouth daily before breakfast.   metFORMIN 500 MG 24 hr tablet Commonly known as:  GLUCOPHAGE-XR TAKE 3 TABLETS EVERY MORNING   ONETOUCH DELICA LANCETS 99991111 Misc USE TO CHECK BLOOD SUGAR 2 TIMES A DAY   PREMARIN vaginal cream Generic drug:  conjugated estrogens   RESTASIS 0.05 % ophthalmic emulsion Generic drug:  cycloSPORINE Place 1 drop into both eyes every 12 (twelve) hours.   sertraline 50 MG tablet Commonly known as:  ZOLOFT Take 50 mg by mouth daily.   simvastatin 40 MG tablet Commonly known as:  ZOCOR Take 1 tablet (40 mg total) by mouth daily at 6 PM.   triamcinolone cream 0.1 % Commonly known as:  KENALOG Reported on 99991111   TRULICITY 1.5 0000000 Sopn Generic drug:  Dulaglutide INJECT ONCE A WEEK       Allergies: No Known Allergies  Past Medical History:  Diagnosis Date  . Allergy   . Arthritis   . Blood transfusion without reported diagnosis 1969   after  ectopic pregnancy  . Diabetes mellitus ([place])   . Hyperlipidemia   . Hypothyroidism     Past Surgical History:  Procedure Laterality Date  . ABDOMINAL HYSTERECTOMY  2000  . ACROMIOPLASTY Bilateral [id number]  . ANKLE SURGERY Left 2011   screw for peroneal tendonitis  . BREAST REDUCTION SURGERY Bilateral   . CARPAL TUNNEL RELEASE Bilateral 1990, 1989  . DILATION AND CURETTAGE OF UTERUS  1970   miscarriage  . [place]  . ELBOW SURGERY Left 2014   tendonitis  . LASIK Left 1999  . MYOMECTOMY  1987   lysis of adhesions  . SEPTOPLASTY  1977  . SHOULDER ARTHROSCOPY Left 2011  . SHOULDER ARTHROSCOPY Right 2013  . ULNAR NERVE TRANSPOSITION Left 2010    Family History  Problem Relation Age of Onset  . Diabetes Mother   . Heart disease Brother   . Early death Brother   . Colon cancer Neg Hx     Social History:  reports that she has never smoked. She has never used smokeless tobacco. She reports that she does not drink alcohol or use drugs.  Review of Systems:   She says her gynecologist told her she had decreased bone density but no report available  HYPERLIPIDEMIA: The lipid abnormality consists of elevated LDL, on simvastatin 40 mg for last few years with good control  Lab Results  Component Value Date   CHOL 117 12/31/2014   HDL 38.10 (L) 12/31/2014   LDLCALC 65 12/31/2014   LDLDIRECT 92.3 01/24/2014   TRIG 70.0 12/31/2014   CHOLHDL 3 12/31/2014   History of mild hypothyroidism, taking levothyroxine 50 mcg with consistent TSH levels  Lab Results  Component Value Date   TSH 2.08 05/08/2015     Diabetic foot exam in 8/17 shows normal monofilament sensation in the toes and plantar surface, no skin lesions or ulcers on the feet and normal pedal pulses    Examination:   BP (!) 100/58   Pulse 74   Ht 4\' 11"  (1.499 m)   Wt 144 lb (65.3 kg)   SpO2 98%   BMI 29.08 kg/m   Body mass index is 29.08 kg/m.   Diabetic Foot Exam - Simple     Simple Foot Form Diabetic Foot exam was performed with the following findings:  Yes 12/12/2015  9:57 AM  Visual Inspection No deformities, no ulcerations, no other skin breakdown bilaterally:  Yes Sensation Testing Intact to touch  and monofilament testing bilaterally:  Yes Pulse Check Posterior Tibialis and Dorsalis pulse intact bilaterally:  Yes Comments      ASSESSMENT/ PLAN:   Diabetes type 2 with BMI 28  The patient's diabetes control is slightly worse with A1c 6.6 compared to 6.0 Although she is not able to exercise again she probably can do better with diet since he has gained a little weight She is trying to be compliant with her Trulicity every week  She is however checking blood sugars only fasting and not after meals as directed  Currently fasting readings are fairly close to normal without overnight hypoglycemia and averaging only 97  Hypercholesterolemia: Will need follow-up  Plan:  She will check more readings at 2 hour after meal time slot Continue Lantus to 10 units Hopefully she can start exercising after treatment of her sciatica with chiropractor or acupuncture      Dayana Dalporto 12/12/2015, 9:42 AM

## 2015-12-12 NOTE — Patient Instructions (Signed)
Check blood sugars on waking up 2-3 per week  Also check blood sugars about 2 hours after a meal and do this after different meals by rotation  Recommended blood sugar levels on waking up is 90-130 and about 2 hours after meal is 130-160  Please bring your blood sugar monitor to each visit, thank you

## 2015-12-12 NOTE — Addendum Note (Signed)
Addended by: Elayne Snare on: 12/12/2015 10:03 AM   Modules accepted: Orders

## 2015-12-16 ENCOUNTER — Other Ambulatory Visit: Payer: Self-pay | Admitting: Endocrinology

## 2016-02-16 ENCOUNTER — Other Ambulatory Visit: Payer: Self-pay | Admitting: Endocrinology

## 2016-03-09 ENCOUNTER — Other Ambulatory Visit (INDEPENDENT_AMBULATORY_CARE_PROVIDER_SITE_OTHER): Payer: Medicare Other

## 2016-03-09 DIAGNOSIS — E119 Type 2 diabetes mellitus without complications: Secondary | ICD-10-CM | POA: Diagnosis not present

## 2016-03-09 LAB — COMPREHENSIVE METABOLIC PANEL
ALBUMIN: 4.3 g/dL (ref 3.5–5.2)
ALK PHOS: 42 U/L (ref 39–117)
ALT: 27 U/L (ref 0–35)
AST: 18 U/L (ref 0–37)
BUN: 12 mg/dL (ref 6–23)
CALCIUM: 9.7 mg/dL (ref 8.4–10.5)
CHLORIDE: 105 meq/L (ref 96–112)
CO2: 31 mEq/L (ref 19–32)
CREATININE: 0.69 mg/dL (ref 0.40–1.20)
GFR: 90.44 mL/min (ref >60.00)
Glucose, Bld: 99 mg/dL (ref 70–99)
POTASSIUM: 3.9 meq/L (ref 3.5–5.1)
SODIUM: 142 meq/L (ref 135–145)
TOTAL PROTEIN: 6.6 g/dL (ref 6.0–8.3)
Total Bilirubin: 0.5 mg/dL (ref 0.2–1.2)

## 2016-03-09 LAB — LIPID PANEL
CHOLESTEROL: 130 mg/dL (ref 0–200)
HDL: 35 mg/dL — ABNORMAL LOW (ref >39.00)
LDL CALC: 72 mg/dL (ref 0–99)
NonHDL: 94.84
TRIGLYCERIDES: 115 mg/dL (ref 0.0–149.0)
Total CHOL/HDL Ratio: 4
VLDL: 23 mg/dL (ref 0.0–40.0)

## 2016-03-09 LAB — HEMOGLOBIN A1C: Hgb A1c MFr Bld: 6.7 % — ABNORMAL HIGH (ref 4.6–6.5)

## 2016-03-12 ENCOUNTER — Ambulatory Visit (INDEPENDENT_AMBULATORY_CARE_PROVIDER_SITE_OTHER): Payer: Medicare Other | Admitting: Endocrinology

## 2016-03-12 ENCOUNTER — Encounter: Payer: Self-pay | Admitting: Endocrinology

## 2016-03-12 VITALS — BP 120/68 | Ht 59.0 in | Wt 142.0 lb

## 2016-03-12 DIAGNOSIS — Z23 Encounter for immunization: Secondary | ICD-10-CM | POA: Diagnosis not present

## 2016-03-12 DIAGNOSIS — E1165 Type 2 diabetes mellitus with hyperglycemia: Secondary | ICD-10-CM

## 2016-03-12 DIAGNOSIS — E039 Hypothyroidism, unspecified: Secondary | ICD-10-CM | POA: Diagnosis not present

## 2016-03-12 DIAGNOSIS — Z794 Long term (current) use of insulin: Secondary | ICD-10-CM | POA: Diagnosis not present

## 2016-03-12 NOTE — Patient Instructions (Addendum)
More after meal checks and exercise

## 2016-03-12 NOTE — Progress Notes (Signed)
Patient ID: Jennifer Baker, female   DOB: 09/05/1950, 65 y.o.   MRN: [id number]   Reason for Appointment: Diabetes follow-up   History of Present Illness   Diagnosis: Type 2 DIABETES MELITUS, date of diagnosis:1997    She has been on basal insulin after a trial of metformin and Byetta and has been on Lantus for several years She had done better with adding Victoza in 05/2010 with weight loss, reduced insulin dose and better control Because of occasional noncompliance with Victoza she was switched to Trulicity in [id number] With this she has had no side effects of nausea and is able to take it every Sunday  She was able to cut back on her portions with starting Trulicity and reduce Lantus dosage    Oral hypoglycemic drug: Metformin ER 1500 mg        Insulin regimen: Lantus 10 units hs   .  Her A1c is slightly higher at 6.7, has been as low as 6.2 before    Current blood sugar patterns and problems identified:   She has checked her blood sugar only occasionally and mostly in the morning or midday, usually before her first meal  Although her weight is slightly better her sugars are probably higher after meals when she is eating more carbohydrates or does not remember to check her readings  Although she is able to do some walking she thinks she is too busy to find any time to exercise now  Today she is more concerned about the cost of her prescriptions other than drugs   Side effects from diabetes medications: None              Monitors blood glucose:  irregularly.    Glucometer: One Touch.           Blood Glucose readings from meter download show:  AVERAGE 113 with range 89-160, all readings before noon   Hypoglycemia frequency:  none  .           Meals: 3 meals per day. Watching her portions at meals usually, less meat and more chicken but sometimes more carbohydrates  Physical activity: exercise:  none, not motivated  Dietician visit: Most recent: 2012, also with husband  in 2014           Wt Readings from Last 3 Encounters:  03/12/16 142 lb (64.4 kg)  12/12/15 144 lb (65.3 kg)  08/12/15 140 lb 9.6 oz (63.8 kg)   LABS:  Lab Results  Component Value Date   HGBA1C 6.7 (H) 03/09/2016   HGBA1C 6.6 (H) 12/09/2015   HGBA1C 6.2 08/07/2015   Lab Results  Component Value Date   MICROALBUR 0.9 05/08/2015   LDLCALC 72 03/09/2016   CREATININE 0.69 03/09/2016     Lab on 03/09/2016  Component Date Value Ref Range Status  . Hgb A1c MFr Bld 03/09/2016 6.7* 4.6 - 6.5 % Final  . Sodium 03/09/2016 142  135 - 145 mEq/L Final  . Potassium 03/09/2016 3.9  3.5 - 5.1 mEq/L Final  . Chloride 03/09/2016 105  96 - 112 mEq/L Final  . CO2 03/09/2016 31  19 - 32 mEq/L Final  . Glucose, Bld 03/09/2016 99  70 - 99 mg/dL Final  . BUN 03/09/2016 12  6 - 23 mg/dL Final  . Creatinine, Ser 03/09/2016 0.69  0.40 - 1.20 mg/dL Final  . Total Bilirubin 03/09/2016 0.5  0.2 - 1.2 mg/dL Final  . Alkaline Phosphatase 03/09/2016 42  39 - 117  U/L Final  . AST 03/09/2016 18  0 - 37 U/L Final  . ALT 03/09/2016 27  0 - 35 U/L Final  . Total Protein 03/09/2016 6.6  6.0 - 8.3 g/dL Final  . Albumin 03/09/2016 4.3  3.5 - 5.2 g/dL Final  . Calcium 03/09/2016 9.7  8.4 - 10.5 mg/dL Final  . GFR 03/09/2016 90.44  >60.00 mL/min Final  . Cholesterol 03/09/2016 130  0 - 200 mg/dL Final  . Triglycerides 03/09/2016 115.0  0.0 - 149.0 mg/dL Final  . HDL 03/09/2016 35.00* >39.00 mg/dL Final  . VLDL 03/09/2016 23.0  0.0 - 40.0 mg/dL Final  . LDL Cholesterol 03/09/2016 72  0 - 99 mg/dL Final  . Total CHOL/HDL Ratio 03/09/2016 4   Final  . NonHDL 03/09/2016 94.84   Final       Medication List       Accurate as of 03/12/16 11:18 AM. Always use your most recent med list.          calcium carbonate 600 MG Tabs tablet Commonly known as:  OS-CAL Take 600 mg by mouth 2 (two) times daily with a meal.   [facility] PRENATAL GUMMY 0.4-113.5 MG Chew Chew by mouth.   glucose blood test strip Commonly  known as:  ONE TOUCH ULTRA TEST Use to check blood sugars three times per day dx code E11.9   LANTUS SOLOSTAR 100 UNIT/ML Solostar Pen Generic drug:  Insulin Glargine INJECT 10 UNITS SUBCUTANEOUSLY DAILY   levothyroxine 50 MCG tablet Commonly known as:  SYNTHROID, LEVOTHROID Take 50 mcg by mouth daily before breakfast.   metFORMIN 500 MG 24 hr tablet Commonly known as:  GLUCOPHAGE-XR TAKE 3 TABLETS BY MOUTH EVERY MORNING   NOVOTWIST 32G X 5 MM Misc Generic drug:  Insulin Pen Needle USE 2 TIMES DAILY. DX CODE: 123456   ONETOUCH DELICA LANCETS 99991111 Misc USE TO CHECK BLOOD SUGAR 2 TIMES A DAY   PREMARIN vaginal cream Generic drug:  conjugated estrogens   RESTASIS 0.05 % ophthalmic emulsion Generic drug:  cycloSPORINE Place 1 drop into both eyes every 12 (twelve) hours.   sertraline 50 MG tablet Commonly known as:  ZOLOFT Take 50 mg by mouth daily.   simvastatin 40 MG tablet Commonly known as:  ZOCOR Take 1 tablet (40 mg total) by mouth daily at 6 PM.   triamcinolone cream 0.1 % Commonly known as:  KENALOG Reported on 99991111   TRULICITY 1.5 0000000 Sopn Generic drug:  Dulaglutide INJECT ONCE A WEEK       Allergies: No Known Allergies  Past Medical History:  Diagnosis Date  . Allergy   . Arthritis   . Blood transfusion without reported diagnosis 1969   after ectopic pregnancy  . Diabetes mellitus ([place])   . Hyperlipidemia   . Hypothyroidism     Past Surgical History:  Procedure Laterality Date  . ABDOMINAL HYSTERECTOMY  2000  . ACROMIOPLASTY Bilateral [id number]  . ANKLE SURGERY Left 2011   screw for peroneal tendonitis  . BREAST REDUCTION SURGERY Bilateral   . CARPAL TUNNEL RELEASE Bilateral 1990, 1989  . DILATION AND CURETTAGE OF UTERUS  1970   miscarriage  . [place]  . ELBOW SURGERY Left 2014   tendonitis  . LASIK Left 1999  . MYOMECTOMY  1987   lysis of adhesions  . SEPTOPLASTY  1977  . SHOULDER ARTHROSCOPY  Left 2011  . SHOULDER ARTHROSCOPY Right 2013  . ULNAR NERVE TRANSPOSITION Left 2010  Family History  Problem Relation Age of Onset  . Diabetes Mother   . Heart disease Brother   . Early death Brother   . Colon cancer Neg Hx     Social History:  reports that she has never smoked. She has never used smokeless tobacco. She reports that she does not drink alcohol or use drugs.  Review of Systems:   She says her gynecologist told her she had decreased bone density    HYPERLIPIDEMIA: The lipid abnormality consists of elevated LDL, on simvastatin 40 mg for last few years with good control  Lab Results  Component Value Date   CHOL 130 03/09/2016   HDL 35.00 (L) 03/09/2016   LDLCALC 72 03/09/2016   LDLDIRECT 92.3 01/24/2014   TRIG 115.0 03/09/2016   CHOLHDL 4 03/09/2016   History of mild hypothyroidism, taking levothyroxine 50 mcg with consistent TSH levels  Lab Results  Component Value Date   TSH 2.08 05/08/2015     Diabetic foot exam in 8/17 shows normal monofilament sensation in the toes and plantar surface, no skin lesions or ulcers on the feet and normal pedal pulses    Examination:   BP 120/68   Ht 4\' 11"  (1.499 m)   Wt 142 lb (64.4 kg)   BMI 28.68 kg/m   Body mass index is 28.68 kg/m.      ASSESSMENT/ PLAN:   Diabetes type 2 with BMI 28  The patient's diabetes control is slightly worse with A1c 6.7 Most likely this is related to post prandial hyperglycemia which she does not monitor Since fasting readings are fairly good she does not need to change her Lantus Although she is  able to exercise again with some walking she is not motivated to do this She is trying to be compliant with her Trulicity every week Again reminded her to check readings after meals or at least at bedtime  Hypercholesterolemia: Will need to continue her simvastatin Needs more exercise because of her low HDL  Patient Instructions  More after meal checks and exercise         Alla Sloma 03/12/2016, 11:18 AM

## 2016-04-29 LAB — HM DIABETES EYE EXAM

## 2016-05-17 ENCOUNTER — Other Ambulatory Visit: Payer: Self-pay | Admitting: Internal Medicine

## 2016-05-17 DIAGNOSIS — Z1231 Encounter for screening mammogram for malignant neoplasm of breast: Secondary | ICD-10-CM

## 2016-05-27 ENCOUNTER — Ambulatory Visit: Payer: Medicare Other

## 2016-05-29 ENCOUNTER — Other Ambulatory Visit: Payer: Self-pay | Admitting: Endocrinology

## 2016-05-31 ENCOUNTER — Other Ambulatory Visit: Payer: Self-pay | Admitting: Endocrinology

## 2016-06-01 ENCOUNTER — Encounter: Payer: Self-pay | Admitting: Endocrinology

## 2016-06-10 ENCOUNTER — Other Ambulatory Visit (INDEPENDENT_AMBULATORY_CARE_PROVIDER_SITE_OTHER): Payer: Medicare Other

## 2016-06-10 DIAGNOSIS — E1165 Type 2 diabetes mellitus with hyperglycemia: Secondary | ICD-10-CM | POA: Diagnosis not present

## 2016-06-10 DIAGNOSIS — Z794 Long term (current) use of insulin: Secondary | ICD-10-CM

## 2016-06-10 DIAGNOSIS — E039 Hypothyroidism, unspecified: Secondary | ICD-10-CM | POA: Diagnosis not present

## 2016-06-10 LAB — BASIC METABOLIC PANEL
BUN: 14 mg/dL (ref 6–23)
CO2: 27 mEq/L (ref 19–32)
CREATININE: 0.61 mg/dL (ref 0.40–1.20)
Calcium: 9.3 mg/dL (ref 8.4–10.5)
Chloride: 107 mEq/L (ref 96–112)
GFR: 104.18 mL/min (ref >60.00)
GLUCOSE: 86 mg/dL (ref 70–99)
Potassium: 3.8 mEq/L (ref 3.5–5.1)
Sodium: 140 mEq/L (ref 135–145)

## 2016-06-10 LAB — TSH: TSH: 1.38 u[IU]/mL (ref 0.35–4.50)

## 2016-06-10 LAB — HEMOGLOBIN A1C: Hgb A1c MFr Bld: 6.4 % (ref 4.6–6.5)

## 2016-06-15 ENCOUNTER — Encounter: Payer: Self-pay | Admitting: Endocrinology

## 2016-06-15 ENCOUNTER — Ambulatory Visit (INDEPENDENT_AMBULATORY_CARE_PROVIDER_SITE_OTHER): Payer: Medicare Other | Admitting: Endocrinology

## 2016-06-15 VITALS — BP 108/60 | HR 64 | Ht 59.0 in | Wt 141.0 lb

## 2016-06-15 DIAGNOSIS — E119 Type 2 diabetes mellitus without complications: Secondary | ICD-10-CM

## 2016-06-15 NOTE — Progress Notes (Signed)
Patient ID: Jennifer Baker, female   DOB: 1951/03/21, 66 y.o.   MRN: [id number]   Reason for Appointment: Diabetes follow-up   History of Present Illness   Diagnosis: Type 2 DIABETES MELITUS, date of diagnosis:1997    She has been on basal insulin after a trial of metformin and Byetta and has been on Lantus for several years She had done better with adding Victoza in 05/2010 with weight loss, reduced insulin dose and better control Because of occasional noncompliance with Victoza she was switched to Trulicity in [id number] With this she has had no side effects of nausea and is able to take it every Sunday  She was able to cut back on her portions with starting Trulicity and reduce Lantus dosage    Oral hypoglycemic drug: Metformin ER 1500 mg        Insulin regimen: Lantus 10 units hs   .  Her A1c is slightly better at 6.4, previously range 6.2 -6.7    Current blood sugar patterns and problems identified:   She has checked her blood sugar only occasionally and mostly in the morning or midday, usually before her first meal  Although her weight is slightly better she has not done any exercise as discussed on each visit  She says that she is not motivated to go outside and walk especially with cold weather  However she is trying to cut back on carbohydrates such as rice and also some reduction in sweets but not consistently  She thinks she has regular with her Trulicity  Has done a couple of readings later at night and these are fairly good  FASTING readings are near normal although not in the low normal range recently    Side effects from diabetes medications: None              Monitors blood glucose:  irregularly.    Glucometer: One Touch.           Blood Glucose readings from meter download show:  AVERAGE FASTING 95 Nonfasting readings 79-124 with overall median 97, mostly checking in the morning   Hypoglycemia frequency:  none  .           Meals: 3 meals per day.  Watching her portions at meals usually, less meat and more chicken but sometimes more starches or sweets  Physical activity: exercise:  none, not motivated  Dietician visit: Most recent: 2012, also with husband in 2014           Wt Readings from Last 3 Encounters:  06/15/16 141 lb (64 kg)  03/12/16 142 lb (64.4 kg)  12/12/15 144 lb (65.3 kg)   LABS:  Lab Results  Component Value Date   HGBA1C 6.4 06/10/2016   HGBA1C 6.7 (H) 03/09/2016   HGBA1C 6.6 (H) 12/09/2015   Lab Results  Component Value Date   MICROALBUR 0.9 05/08/2015   LDLCALC 72 03/09/2016   CREATININE 0.61 06/10/2016    Other active problems: See review of systems  Lab on 06/10/2016  Component Date Value Ref Range Status  . Hgb A1c MFr Bld 06/10/2016 6.4  4.6 - 6.5 % Final  . Sodium 06/10/2016 140  135 - 145 mEq/L Final  . Potassium 06/10/2016 3.8  3.5 - 5.1 mEq/L Final  . Chloride 06/10/2016 107  96 - 112 mEq/L Final  . CO2 06/10/2016 27  19 - 32 mEq/L Final  . Glucose, Bld 06/10/2016 86  70 - 99 mg/dL Final  . BUN  06/10/2016 14  6 - 23 mg/dL Final  . Creatinine, Ser 06/10/2016 0.61  0.40 - 1.20 mg/dL Final  . Calcium 06/10/2016 9.3  8.4 - 10.5 mg/dL Final  . GFR 06/10/2016 104.18  >60.00 mL/min Final  . TSH 06/10/2016 1.38  0.35 - 4.50 uIU/mL Final     Allergies as of 06/15/2016   No Known Allergies     Medication List       Accurate as of 06/15/16 11:33 AM. Always use your most recent med list.          calcium carbonate 600 MG Tabs tablet Commonly known as:  OS-CAL Take 600 mg by mouth 2 (two) times daily with a meal.   [facility] PRENATAL GUMMY 0.4-113.5 MG Chew Chew by mouth.   glucose blood test strip Commonly known as:  ONE TOUCH ULTRA TEST Use to check blood sugars three times per day dx code E11.9   LANTUS SOLOSTAR 100 UNIT/ML Solostar Pen Generic drug:  Insulin Glargine INJECT 10 UNITS SUBCUTANEOUSLY DAILY   levothyroxine 50 MCG tablet Commonly known as:  SYNTHROID, LEVOTHROID Take  50 mcg by mouth daily before breakfast.   metFORMIN 500 MG 24 hr tablet Commonly known as:  GLUCOPHAGE-XR TAKE 3 TABLETS BY MOUTH EVERY MORNING   NOVOTWIST 32G X 5 MM Misc Generic drug:  Insulin Pen Needle USE 2 TIMES DAILY. DX CODE: 123456   ONETOUCH DELICA LANCETS 99991111 Misc USE TO CHECK BLOOD SUGAR 2 TIMES A DAY   PREMARIN vaginal cream Generic drug:  conjugated estrogens   RESTASIS 0.05 % ophthalmic emulsion Generic drug:  cycloSPORINE Place 1 drop into both eyes every 12 (twelve) hours.   sertraline 50 MG tablet Commonly known as:  ZOLOFT Take 50 mg by mouth daily.   simvastatin 40 MG tablet Commonly known as:  ZOCOR Take 1 tablet (40 mg total) by mouth daily at 6 PM.   triamcinolone cream 0.1 % Commonly known as:  KENALOG Reported on 99991111   TRULICITY 1.5 0000000 Sopn Generic drug:  Dulaglutide INJECT ONCE A WEEK   TRULICITY 1.5 0000000 Sopn Generic drug:  Dulaglutide INJECT ONCE A WEEK       Allergies: No Known Allergies  Past Medical History:  Diagnosis Date  . Allergy   . Arthritis   . Blood transfusion without reported diagnosis 1969   after ectopic pregnancy  . Diabetes mellitus ([place])   . Hyperlipidemia   . Hypothyroidism     Past Surgical History:  Procedure Laterality Date  . ABDOMINAL HYSTERECTOMY  2000  . ACROMIOPLASTY Bilateral [id number]  . ANKLE SURGERY Left 2011   screw for peroneal tendonitis  . BREAST REDUCTION SURGERY Bilateral   . CARPAL TUNNEL RELEASE Bilateral 1990, 1989  . DILATION AND CURETTAGE OF UTERUS  1970   miscarriage  . [place]  . ELBOW SURGERY Left 2014   tendonitis  . LASIK Left 1999  . MYOMECTOMY  1987   lysis of adhesions  . SEPTOPLASTY  1977  . SHOULDER ARTHROSCOPY Left 2011  . SHOULDER ARTHROSCOPY Right 2013  . ULNAR NERVE TRANSPOSITION Left 2010    Family History  Problem Relation Age of Onset  . Diabetes Mother   . Heart disease Brother   . Early death Brother     . Colon cancer Neg Hx     Social History:  reports that she has never smoked. She has never used smokeless tobacco. She reports that she does not drink alcohol or use  drugs.  Review of Systems:  She says she is supposed to take calcium and vitamin D combination as suggested by her gynecologist for ?  Osteopenia but she cannot swallow the large tablets   HYPERLIPIDEMIA: The lipid abnormality consists of elevated LDL, on simvastatin 40 mg for last few years with good control  Lab Results  Component Value Date   CHOL 130 03/09/2016   HDL 35.00 (L) 03/09/2016   LDLCALC 72 03/09/2016   LDLDIRECT 92.3 01/24/2014   TRIG 115.0 03/09/2016   CHOLHDL 4 03/09/2016    History of mild hypothyroidism, taking levothyroxine 50 mcg with consistent TSH levels  Lab Results  Component Value Date   TSH 1.38 06/10/2016     Diabetic foot exam in 8/17 shows normal monofilament sensation in the toes and plantar surface, no skin lesions or ulcers on the feet and normal pedal pulses    Examination:   BP 108/60   Pulse 64   Ht 4\' 11"  (1.499 m)   Wt 141 lb (64 kg)   SpO2 98%   BMI 28.48 kg/m   Body mass index is 28.48 kg/m.      ASSESSMENT/ PLAN:   Diabetes type 2 with BMI 28 See history of present illness for detailed discussion of current diabetes management, blood sugar patterns and problems identified  Her blood sugars are excellent with A1c 6.4 and fairly good blood sugars at home She has an average of about 95 for fasting blood sugars without her night hypoglycemia using 10 units Lantus Although she has kept her weight down she is not exercising as directed has not been as motivated especially with cold weather She has a few readings after meals and these are not higher than expected  For now she will continue the same regimen Encouraged her to start walking indoors are outdoors  HYPOTHYROIDISM: Mild and stable with 50 g  ?  Osteopenia: He was check to see if she has had a  vitamin D level and may need to supplement  Patient Instructions  Walk daily        Jennifer Baker 06/15/2016, 11:33 AM

## 2016-06-15 NOTE — Patient Instructions (Signed)
Walk daily 

## 2016-06-16 ENCOUNTER — Ambulatory Visit
Admission: RE | Admit: 2016-06-16 | Discharge: 2016-06-16 | Disposition: A | Discharge status: Home / Self Care | Payer: Medicare Other | Source: Ambulatory Visit | Attending: Internal Medicine | Admitting: Internal Medicine

## 2016-06-16 DIAGNOSIS — Z1231 Encounter for screening mammogram for malignant neoplasm of breast: Secondary | ICD-10-CM

## 2016-09-29 ENCOUNTER — Telehealth: Payer: Self-pay | Admitting: Endocrinology

## 2016-09-29 NOTE — Telephone Encounter (Signed)
Patient called to cancel appointments and let us know she was "cancelling our services". Will no longer be a patient.

## 2016-09-29 NOTE — Telephone Encounter (Signed)
Please advise on what needs to be done? Thanks!

## 2016-10-08 ENCOUNTER — Other Ambulatory Visit: Payer: Medicare Other

## 2016-10-12 ENCOUNTER — Ambulatory Visit: Payer: Medicare Other | Admitting: Endocrinology

## 2016-11-01 ENCOUNTER — Other Ambulatory Visit: Payer: Self-pay

## 2016-11-01 MED ORDER — DULAGLUTIDE 1.5 MG/0.5ML ~~LOC~~ SOAJ: SUBCUTANEOUS | 1 refills | Status: DC

## 2016-12-09 ENCOUNTER — Other Ambulatory Visit: Payer: Self-pay

## 2016-12-09 MED ORDER — METFORMIN HCL ER 500 MG PO TB24: 1500.0000 mg | ORAL_TABLET | Freq: Every morning | ORAL | 3 refills | Status: DC

## 2017-03-01 ENCOUNTER — Telehealth: Payer: Self-pay | Admitting: Endocrinology

## 2017-03-01 NOTE — Telephone Encounter (Signed)
Please advise if okay to refill. Last OV was 05/2016

## 2017-03-01 NOTE — Telephone Encounter (Signed)
Needs appointment before this can be refilled

## 2017-03-01 NOTE — Telephone Encounter (Signed)
Strips have been refused with message for patient to make an appointment.

## 2017-06-08 ENCOUNTER — Other Ambulatory Visit: Payer: Self-pay | Admitting: Internal Medicine

## 2017-06-08 DIAGNOSIS — Z1231 Encounter for screening mammogram for malignant neoplasm of breast: Secondary | ICD-10-CM

## 2017-06-30 ENCOUNTER — Ambulatory Visit
Admission: RE | Admit: 2017-06-30 | Discharge: 2017-06-30 | Disposition: A | Discharge status: Home / Self Care | Payer: Medicare Other | Source: Ambulatory Visit | Attending: Internal Medicine | Admitting: Internal Medicine

## 2017-06-30 DIAGNOSIS — Z1231 Encounter for screening mammogram for malignant neoplasm of breast: Secondary | ICD-10-CM

## 2017-08-08 ENCOUNTER — Other Ambulatory Visit: Payer: Self-pay | Admitting: Internal Medicine

## 2017-08-08 DIAGNOSIS — R945 Abnormal results of liver function studies: Principal | ICD-10-CM

## 2017-08-08 DIAGNOSIS — M533 Sacrococcygeal disorders, not elsewhere classified: Secondary | ICD-10-CM

## 2017-08-08 DIAGNOSIS — R7989 Other specified abnormal findings of blood chemistry: Secondary | ICD-10-CM

## 2017-08-15 ENCOUNTER — Ambulatory Visit
Admission: RE | Admit: 2017-08-15 | Discharge: 2017-08-15 | Disposition: A | Discharge status: Home / Self Care | Payer: Medicare Other | Source: Ambulatory Visit | Attending: Internal Medicine | Admitting: Internal Medicine

## 2017-08-15 ENCOUNTER — Other Ambulatory Visit: Payer: Self-pay | Admitting: Internal Medicine

## 2017-08-15 DIAGNOSIS — R945 Abnormal results of liver function studies: Principal | ICD-10-CM

## 2017-08-15 DIAGNOSIS — M533 Sacrococcygeal disorders, not elsewhere classified: Secondary | ICD-10-CM

## 2017-08-15 DIAGNOSIS — R7989 Other specified abnormal findings of blood chemistry: Secondary | ICD-10-CM

## 2017-08-18 ENCOUNTER — Other Ambulatory Visit: Payer: Medicare Other

## 2018-01-18 ENCOUNTER — Other Ambulatory Visit: Payer: Self-pay | Admitting: Internal Medicine

## 2018-01-18 DIAGNOSIS — R519 Headache, unspecified: Secondary | ICD-10-CM

## 2018-01-18 DIAGNOSIS — R51 Headache: Principal | ICD-10-CM

## 2018-01-25 ENCOUNTER — Ambulatory Visit
Admission: RE | Admit: 2018-01-25 | Discharge: 2018-01-25 | Disposition: A | Discharge status: Home / Self Care | Payer: Medicare Other | Source: Ambulatory Visit | Attending: Internal Medicine | Admitting: Internal Medicine

## 2018-01-25 DIAGNOSIS — R519 Headache, unspecified: Secondary | ICD-10-CM

## 2018-01-25 DIAGNOSIS — R51 Headache: Principal | ICD-10-CM

## 2018-11-07 ENCOUNTER — Other Ambulatory Visit: Payer: Self-pay | Admitting: Internal Medicine

## 2018-11-07 DIAGNOSIS — R1011 Right upper quadrant pain: Secondary | ICD-10-CM

## 2018-11-08 ENCOUNTER — Other Ambulatory Visit: Payer: Self-pay

## 2018-11-08 ENCOUNTER — Ambulatory Visit
Admission: RE | Admit: 2018-11-08 | Discharge: 2018-11-08 | Disposition: A | Discharge status: Home / Self Care | Payer: Medicare Other | Source: Ambulatory Visit | Attending: Internal Medicine | Admitting: Internal Medicine

## 2018-11-08 DIAGNOSIS — R1011 Right upper quadrant pain: Secondary | ICD-10-CM

## 2021-06-01 ENCOUNTER — Ambulatory Visit: Payer: Medicare Other | Admitting: Neurology

## 2021-06-01 ENCOUNTER — Encounter: Payer: Self-pay | Admitting: Neurology

## 2021-07-23 ENCOUNTER — Encounter: Payer: Self-pay | Admitting: *Deleted

## 2021-07-23 ENCOUNTER — Other Ambulatory Visit: Payer: Self-pay | Admitting: *Deleted

## 2021-07-24 ENCOUNTER — Ambulatory Visit: Payer: Medicare Other | Admitting: Neurology

## 2021-07-24 ENCOUNTER — Encounter: Payer: Self-pay | Admitting: Neurology

## 2021-07-24 VITALS — BP 104/54 | HR 63 | Ht 59.0 in | Wt 128.0 lb

## 2021-07-24 DIAGNOSIS — R2689 Other abnormalities of gait and mobility: Secondary | ICD-10-CM | POA: Diagnosis not present

## 2021-07-24 NOTE — Progress Notes (Signed)
? ?Chief Complaint  ?Patient presents with  ? New Patient (Initial Visit)  ?  Rm 16, w husband. Pt referred for hx of falling. Pt has had loss of balance for over a year. When bending over pt feels like she's going to tip over. Has to get up slowly. Not vertigo per pt. Pt reports hearing loss in both ears, worse in R ear. Retired Marine scientist.  ? ? ? ? ?ASSESSMENT AND PLAN ? ?Jennifer Baker is a 71 y.o. female   ?Chronic neck pain radiating pain to left shoulder ?Unsteady gait ? Brisk reflex of bilateral patella ? History of poorly controlled diabetes, no evidence of neuropathy ? Need to rule out cervical spondylitic myelopathy, differentiation diagnosis of her complaints of unsteadiness sensation also include orthostatic hypotension/tachycardia, deconditioning, advised her increase water intake, keep moderate exercise ? ? ?DIAGNOSTIC DATA (LABS, IMAGING, TESTING) ?- I reviewed patient records, labs, notes, testing and imaging myself where available. ? ?Labs, A1C 6.9, in May 14 2021, A1C 11.6 in Oct 2021, ? ?Personally reviewed CT spine in 2015: No significant thoracic disc pathology, degenerative changes at multiple levels, most obvious at L5-S1, but no evidence of spinal cord or nerve root compression ? ?MEDICAL HISTORY: ? ?Jennifer Baker, is a 71 year old female, accompanied by her husband, seen in request by Dr. Crawford Givens, for evaluation ration of unsteady gait, initial evaluation was on July 24, 2021, her primary care physician is Dr.Roberts, Jori Moll, MD  ? ?I reviewed and summarized the referring note. PMHX. ?DM, since 1997 ?HLD ?Hyothyrodism ?S/p Left ankle surgery,  ?Bilateral shoulder surgery. ?Left ulnar transpositional surgery ? ?She has a long history of diabetes, sometimes is not under suboptimal control A1c was 11.30 January 2020, most recent 1 was 6.9 in January 2023, she denies bilateral lower extremity paresthesia or burning pain ? ?She did have a history of osteoarthritis involving different joints,  including elbow, left ankle, had bilateral arthroscopic surgery, since 2020, she complains of worsening neck pain, getting worse since 2022, limited range of motion of her neck, radiating pain to her left shoulder region ? ?Around 2021, she also noticed unsteady gait, denies vertigo, denied dizziness unsteady sensation when sitting down, she most noticed that when she first get up from seated position, or bending over, fell few times ? ?She denies bowel bladder incontinence, ? ?Today I was not able to demonstrate orthostatic blood pressure change or tachycardia, ? ? ? ?PHYSICAL EXAM: ?  ?Vitals:  ? 07/24/21 1103  ?BP: (!) 104/54  ?Pulse: 63  ?Weight: 128 lb (58.1 kg)  ?Height: '4\' 11"'$  (1.499 m)  ? ?Sitting down blood pressure 114/72, 69, standing up 115/57, 87, standing up for 1 minutes 117/75,76 ? ?Body mass index is 25.85 kg/m?. ? ?PHYSICAL EXAMNIATION: ? ?Gen: NAD, conversant, well nourised, well groomed                     ?Cardiovascular: Regular rate rhythm, no peripheral edema, warm, nontender. ?Eyes: Conjunctivae clear without exudates or hemorrhage ?Neck: Supple, no carotid bruits. ?Pulmonary: Clear to auscultation bilaterally  ? ?NEUROLOGICAL EXAM: ? ?MENTAL STATUS: ?Speech: ?   Speech is normal; fluent and spontaneous with normal comprehension.  ?Cognition: ?    Orientation to time, place and person ?    Normal recent and remote memory ?    Normal Attention span and concentration ?    Normal Language, naming, repeating,spontaneous speech ?    Fund of knowledge ?  ?CRANIAL NERVES: ?CN II: Visual  fields are full to confrontation. Pupils are round equal and briskly reactive to light. ?CN III, IV, VI: extraocular movement are normal. No ptosis. ?CN V: Facial sensation is intact to light touch ?CN VII: Face is symmetric with normal eye closure  ?CN VIII: Hearing is normal to causal conversation. ?CN IX, X: Phonation is normal. ?CN XI: Head turning and shoulder shrug are intact ? ?MOTOR: ?There is no pronator  drift of out-stretched arms. Muscle bulk and tone are normal. Muscle strength is normal. ? ?REFLEXES: ?Reflexes are 2+ and symmetric at the biceps, triceps, knees, and absent at ankles. Plantar responses are flexor. ? ?SENSORY: ?Intact to light touch, pinprick and vibratory sensation are intact in fingers and toes. ? ?COORDINATION: ?There is no trunk or limb dysmetria noted. ? ?GAIT/STANCE: ?She needs push-up to get up from seated position, cautious ? ?REVIEW OF SYSTEMS:  ?Full 14 system review of systems performed and notable only for as above ?All other review of systems were negative. ? ? ?ALLERGIES: ?No Known Allergies ? ?HOME MEDICATIONS: ?Current Outpatient Medications  ?Medication Sig Dispense Refill  ? calcium carbonate (OS-CAL) 600 MG TABS tablet Take 600 mg by mouth 2 (two) times daily with a meal.    ? ciclopirox (PENLAC) 8 % solution Apply topically.    ? glucose blood (ONE TOUCH ULTRA TEST) test strip Use to check blood sugars three times per day dx code E11.9 300 each 1  ? JARDIANCE 25 MG TABS tablet Take 25 mg by mouth daily.    ? levothyroxine (SYNTHROID, LEVOTHROID) 50 MCG tablet Take 50 mcg by mouth daily before breakfast.     ? metFORMIN (GLUCOPHAGE-XR) 500 MG 24 hr tablet Take by mouth.    ? NOVOTWIST 32G X 5 MM MISC USE 2 TIMES DAILY. DX CODE: E11.65 200 each 1  ? ONETOUCH DELICA LANCETS 67E MISC USE TO CHECK BLOOD SUGAR 2 TIMES A DAY 200 each 1  ? Prenatal Vit-Min-FA-Fish Oil (CVS PRENATAL GUMMY) 0.4-113.5 MG CHEW Chew by mouth.    ? rosuvastatin (CRESTOR) 20 MG tablet Take 20 mg by mouth daily.    ? sertraline (ZOLOFT) 50 MG tablet Take 1 tablet by mouth daily.    ? triamcinolone cream (KENALOG) 0.1 % Reported on 06/03/2015  11  ? ?No current facility-administered medications for this visit.  ? ? ?PAST MEDICAL HISTORY: ?Past Medical History:  ?Diagnosis Date  ? Allergy   ? Arthritis   ? Blood transfusion without reported diagnosis 1969  ? after ectopic pregnancy  ? Diabetes mellitus ([place])   ?  Hyperlipidemia   ? Hypothyroidism   ? ? ?PAST SURGICAL HISTORY: ?Past Surgical History:  ?Procedure Laterality Date  ? ABDOMINAL HYSTERECTOMY  2000  ? ACROMIOPLASTY Bilateral 1995,2010  ? ANKLE SURGERY Left 2011  ? screw for peroneal tendonitis  ? BREAST REDUCTION SURGERY Bilateral   ? CARPAL TUNNEL RELEASE Bilateral 1990, 1989  ? [place] OF UTERUS  1970  ? miscarriage  ? [place]  ? ELBOW SURGERY Left 2014  ? tendonitis  ? LASIK Left 1999  ? MYOMECTOMY  1987  ? lysis of adhesions  ? SEPTOPLASTY  1977  ? SHOULDER ARTHROSCOPY Left 2011  ? SHOULDER ARTHROSCOPY Right 2013  ? ULNAR NERVE TRANSPOSITION Left 2010  ? ? ?FAMILY HISTORY: ?Family History  ?Problem Relation Age of Onset  ? Hypertension Mother   ? Diabetes Mother   ? Alzheimer's disease Mother   ? Heart disease Brother   ?  Early death Brother   ? Colon cancer Neg Hx   ? ? ?SOCIAL HISTORY: ?Social History  ? ?Socioeconomic History  ? Marital status: Married  ?  Spouse name: Philippa Chester  ? Number of children: Not on file  ? Years of education: Not on file  ? Highest education level: 5th grade  ?Occupational History  ? Not on file  ?Tobacco Use  ? Smoking status: Never  ? Smokeless tobacco: Never  ?Substance and Sexual Activity  ? Alcohol use: No  ?  Alcohol/week: 0.0 standard drinks  ? Drug use: No  ? Sexual activity: Not on file  ?Other Topics Concern  ? Not on file  ?Social History Narrative  ? Lives with husband  ? Ambidextrous mainly left handed  ? Caffeine: 2-3 C of coffee a day, soda minimal   ? ?Social Determinants of Health  ? ?Financial Resource Strain: Not on file  ?Food Insecurity: Not on file  ?Transportation Needs: Not on file  ?Physical Activity: Not on file  ?Stress: Not on file  ?Social Connections: Not on file  ?Intimate Partner Violence: Not on file  ? ? ? ? ?Marcial Pacas, M.D. Ph.D. ? ?[facility] ?[address], [address] ?[place], [place] [address] ?Ph: [phone]) [phone] ?Fax: [phone] ? ?CC:   Crawford Givens, MD ?[place] ?[address] ?[place],  [place] [address]  Lorene Dy, MD   ?

## 2021-07-27 ENCOUNTER — Telehealth: Payer: Self-pay | Admitting: Neurology

## 2021-07-27 NOTE — Telephone Encounter (Signed)
Medicare/supp plan order sent to [facility], [facility] they will reach out to the patient to schedule.  ?

## 2021-07-29 ENCOUNTER — Encounter: Payer: Self-pay | Admitting: Gastroenterology

## 2021-08-08 ENCOUNTER — Other Ambulatory Visit: Payer: Medicare Other

## 2021-08-13 ENCOUNTER — Ambulatory Visit
Admission: RE | Admit: 2021-08-13 | Discharge: 2021-08-13 | Disposition: A | Discharge status: Home / Self Care | Payer: Medicare Other | Source: Ambulatory Visit | Attending: Neurology | Admitting: Neurology

## 2021-08-13 DIAGNOSIS — R2689 Other abnormalities of gait and mobility: Secondary | ICD-10-CM | POA: Diagnosis not present

## 2021-08-17 ENCOUNTER — Telehealth: Payer: Self-pay | Admitting: Neurology

## 2021-08-17 NOTE — Telephone Encounter (Signed)
Spoke with pt and informed of results. Advised Physical Therapy referral. Pt states she is seeing a chiropractor and acupuncturist. She would like to discuss Physical Therapy referral with husband and give a call back. ?

## 2021-08-17 NOTE — Telephone Encounter (Signed)
Please call patient, MRI of cervical spine showed multilevel degenerative changes, mild canal stenosis at C5-6, C4-5, no evidence of cord compression, variable degree of foraminal narrowing, but no evidence of nerve root compression ? ?If she still have have significant neck pain, gait abnormality, I would refer her to physical therapy ? ? ? 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 ?  ?

## 2021-09-08 ENCOUNTER — Other Ambulatory Visit: Payer: Self-pay | Admitting: Orthopedic Surgery

## 2021-09-08 DIAGNOSIS — M8008XA Age-related osteoporosis with current pathological fracture, vertebra(e), initial encounter for fracture: Secondary | ICD-10-CM

## 2021-09-09 ENCOUNTER — Ambulatory Visit
Admission: RE | Admit: 2021-09-09 | Discharge: 2021-09-09 | Disposition: A | Discharge status: Home / Self Care | Payer: Medicare Other | Source: Ambulatory Visit | Attending: Orthopedic Surgery | Admitting: Orthopedic Surgery

## 2021-09-09 DIAGNOSIS — M8008XA Age-related osteoporosis with current pathological fracture, vertebra(e), initial encounter for fracture: Secondary | ICD-10-CM

## 2022-07-30 ENCOUNTER — Encounter: Payer: Self-pay | Admitting: Gastroenterology

## 2023-07-05 ENCOUNTER — Other Ambulatory Visit: Payer: Self-pay | Admitting: Rehabilitation

## 2023-07-05 DIAGNOSIS — M47816 Spondylosis without myelopathy or radiculopathy, lumbar region: Secondary | ICD-10-CM

## 2023-07-25 ENCOUNTER — Other Ambulatory Visit

## 2023-08-02 ENCOUNTER — Ambulatory Visit
Admission: RE | Admit: 2023-08-02 | Discharge: 2023-08-02 | Disposition: A | Discharge status: Home / Self Care | Source: Ambulatory Visit | Attending: Rehabilitation | Admitting: Rehabilitation

## 2023-08-02 DIAGNOSIS — M47816 Spondylosis without myelopathy or radiculopathy, lumbar region: Secondary | ICD-10-CM
